# Patient Record
Sex: Male | Born: 1955
Health system: Southern US, Community
[De-identification: ages and names within clinical notes are randomized; demographics above are authoritative.]

## PROBLEM LIST (undated history)

## (undated) DIAGNOSIS — E785 Hyperlipidemia, unspecified: Secondary | ICD-10-CM

## (undated) DIAGNOSIS — T7840XA Allergy, unspecified, initial encounter: Secondary | ICD-10-CM

## (undated) DIAGNOSIS — M1990 Unspecified osteoarthritis, unspecified site: Secondary | ICD-10-CM

## (undated) HISTORY — PX: HIP SURGERY: SHX245

## (undated) HISTORY — DX: Allergy, unspecified, initial encounter: T78.40XA

## (undated) HISTORY — DX: Unspecified osteoarthritis, unspecified site: M19.90

## (undated) HISTORY — PX: SHOULDER SURGERY: SHX246

## (undated) HISTORY — PX: KNEE SURGERY: SHX244

## (undated) HISTORY — DX: Hyperlipidemia, unspecified: E78.5

---

## 2013-03-02 ENCOUNTER — Ambulatory Visit (INDEPENDENT_AMBULATORY_CARE_PROVIDER_SITE_OTHER): Payer: Medicare HMO | Admitting: Family Medicine

## 2013-03-02 ENCOUNTER — Encounter: Payer: Self-pay | Admitting: Family Medicine

## 2013-03-02 VITALS — BP 146/87 | HR 77 | Temp 98.0°F | Resp 18 | Ht 73.0 in | Wt 360.0 lb

## 2013-03-02 DIAGNOSIS — E785 Hyperlipidemia, unspecified: Secondary | ICD-10-CM

## 2013-03-02 DIAGNOSIS — R600 Localized edema: Secondary | ICD-10-CM

## 2013-03-02 DIAGNOSIS — Z23 Encounter for immunization: Secondary | ICD-10-CM

## 2013-03-02 DIAGNOSIS — R609 Edema, unspecified: Secondary | ICD-10-CM

## 2013-03-02 DIAGNOSIS — Z139 Encounter for screening, unspecified: Secondary | ICD-10-CM

## 2013-03-02 DIAGNOSIS — Z0000 Encounter for general adult medical examination without abnormal findings: Secondary | ICD-10-CM

## 2013-03-02 LAB — CBC WITH DIFFERENTIAL/PLATELET
BASOS ABS: 0 10*3/uL (ref 0.0–0.1)
BASOS PCT: 0 % (ref 0–1)
Eosinophils Absolute: 0.1 10*3/uL (ref 0.0–0.7)
Eosinophils Relative: 1 % (ref 0–5)
HCT: 44.1 % (ref 39.0–52.0)
Hemoglobin: 15.2 g/dL (ref 13.0–17.0)
LYMPHS PCT: 40 % (ref 12–46)
Lymphs Abs: 2.2 10*3/uL (ref 0.7–4.0)
MCH: 28.9 pg (ref 26.0–34.0)
MCHC: 34.5 g/dL (ref 30.0–36.0)
MCV: 83.8 fL (ref 78.0–100.0)
MONO ABS: 0.6 10*3/uL (ref 0.1–1.0)
Monocytes Relative: 10 % (ref 3–12)
NEUTROS PCT: 49 % (ref 43–77)
Neutro Abs: 2.6 10*3/uL (ref 1.7–7.7)
PLATELETS: 232 10*3/uL (ref 150–400)
RBC: 5.26 MIL/uL (ref 4.22–5.81)
RDW: 13.7 % (ref 11.5–15.5)
WBC: 5.4 10*3/uL (ref 4.0–10.5)

## 2013-03-02 LAB — POCT URINALYSIS DIPSTICK
Bilirubin, UA: NEGATIVE
Blood, UA: NEGATIVE
Glucose, UA: NEGATIVE
Ketones, UA: NEGATIVE
LEUKOCYTES UA: NEGATIVE
NITRITE UA: NEGATIVE
PROTEIN UA: NEGATIVE
Spec Grav, UA: 1.015
UROBILINOGEN UA: 4
pH, UA: 7

## 2013-03-02 LAB — COMPLETE METABOLIC PANEL WITH GFR
ALT: 23 U/L (ref 0–53)
AST: 19 U/L (ref 0–37)
Albumin: 4.1 g/dL (ref 3.5–5.2)
Alkaline Phosphatase: 93 U/L (ref 39–117)
BUN: 11 mg/dL (ref 6–23)
CALCIUM: 9.1 mg/dL (ref 8.4–10.5)
CHLORIDE: 103 meq/L (ref 96–112)
CO2: 30 meq/L (ref 19–32)
Creat: 0.95 mg/dL (ref 0.50–1.35)
GFR, Est African American: >89 mL/min
GFR, Est Non African American: 88 mL/min
Glucose, Bld: 81 mg/dL (ref 70–99)
Potassium: 4.8 mEq/L (ref 3.5–5.3)
SODIUM: 142 meq/L (ref 135–145)
TOTAL PROTEIN: 7.6 g/dL (ref 6.0–8.3)
Total Bilirubin: 0.9 mg/dL (ref 0.2–1.2)

## 2013-03-02 LAB — IFOBT (OCCULT BLOOD): IFOBT: NEGATIVE

## 2013-03-02 LAB — TSH: TSH: 0.073 u[IU]/mL — AB (ref 0.350–4.500)

## 2013-03-02 LAB — LIPID PANEL
Cholesterol: 198 mg/dL (ref 0–200)
HDL: 40 mg/dL (ref >39)
LDL CALC: 134 mg/dL — AB (ref 0–99)
Total CHOL/HDL Ratio: 5 Ratio
Triglycerides: 118 mg/dL (ref <150)
VLDL: 24 mg/dL (ref 0–40)

## 2013-03-02 LAB — T4, FREE: FREE T4: 1.28 ng/dL (ref 0.80–1.80)

## 2013-03-02 LAB — T3, FREE: T3, Free: 2.7 pg/mL (ref 2.3–4.2)

## 2013-03-02 MED ORDER — FUROSEMIDE 40 MG PO TABS: 40.0000 mg | ORAL_TABLET | Freq: Every day | ORAL | Status: DC

## 2013-03-02 MED ORDER — MELOXICAM 15 MG PO TABS: 15.0000 mg | ORAL_TABLET | Freq: Every day | ORAL | Status: DC

## 2013-03-02 MED ORDER — FLUTICASONE PROPIONATE 50 MCG/ACT NA SUSP: 2.0000 | Freq: Every day | NASAL | Status: DC

## 2013-03-02 MED ORDER — SIMVASTATIN 80 MG PO TABS: 80.0000 mg | ORAL_TABLET | Freq: Every day | ORAL | Status: DC

## 2013-03-02 NOTE — Progress Notes (Signed)
Subjective:    Patient ID: Nathan Fleming, male    DOB: March 20, 1955, 58 y.o.   MRN: [id number]  HPI  This 58 y.o. AA male is new to [place]. He is disabled and on [facility]; this is a subsequent annual exam. He has chronic lower ext edema which is effectively treated w/ Furosemide. Chronic dyslipidemia is treated w/ Simvastatin w/o mention of adverse effects. Pt's disabling conditions include DJD, s/p joint replacement surgeries and chronic LBP.   Prior to Admission medications   Medication Sig Start Date End Date Taking? Authorizing Provider  fluticasone (FLONASE) 50 MCG/ACT nasal spray Place 2 sprays into both nostrils daily.   Yes   furosemide (LASIX) 40 MG tablet Take 1 tablet (40 mg total) by mouth daily.   Yes   meloxicam (MOBIC) 15 MG tablet Take 1 tablet (15 mg total) by mouth daily.   Yes   simvastatin (ZOCOR) 80 MG tablet Take 1 tablet (80 mg total) by mouth daily.   Yes     Review of Systems  Constitutional: Negative.   HENT: Positive for congestion, postnasal drip, rhinorrhea and sneezing. Negative for sinus pressure, sore throat and trouble swallowing.   Eyes: Negative.   Respiratory: Positive for cough. Negative for choking, chest tightness and wheezing.   Cardiovascular: Negative.   Gastrointestinal: Negative.   Musculoskeletal: Positive for arthralgias, back pain and gait problem. Negative for joint swelling, myalgias and neck pain.  Skin: Negative.   Allergic/Immunologic: Negative.   Neurological: Negative.   Hematological: Negative.   Psychiatric/Behavioral: Negative.       Objective:   Physical Exam  Nursing note and vitals reviewed. Constitutional: He is oriented to person, place, and time. Vital signs are normal. He appears well-developed and well-nourished.  HENT:  Head: Normocephalic and atraumatic.  Right Ear: Hearing and external ear normal.  Left Ear: Hearing and external ear normal.  Nose: Nose normal. No mucosal edema, nasal deformity or septal deviation.    Mouth/Throat: Uvula is midline and oropharynx is clear and moist. No oral lesions. Normal dentition.  Eyes: Conjunctivae, EOM and lids are normal. Pupils are equal, round, and reactive to light. No scleral icterus.  Fundoscopic exam:      The right eye shows no arteriolar narrowing, no hemorrhage and no papilledema. The right eye shows red reflex.       The left eye shows no arteriolar narrowing, no hemorrhage and no papilledema. The left eye shows red reflex.  Neck: Normal range of motion. Neck supple. No hepatojugular reflux and no JVD present. No spinous process tenderness and no muscular tenderness present. Carotid bruit is not present. Normal range of motion present. No mass and no thyromegaly present.  Cardiovascular: Normal rate, regular rhythm, S1 normal, S2 normal, normal heart sounds, intact distal pulses and normal pulses.   No extrasystoles are present. PMI is displaced.  Exam reveals no gallop and no friction rub.   No murmur heard. Pulmonary/Chest: Effort normal and breath sounds normal. No respiratory distress. He has no wheezes. He has no rhonchi. He has no rales.  Decreased BS at bases.  Abdominal: Soft. Bowel sounds are normal. He exhibits no distension, no pulsatile midline mass and no mass. There is no hepatosplenomegaly. There is no tenderness. There is no guarding and no CVA tenderness. No hernia.  Increased abd girth due to obesity; difficult to assess intra-abd organs.  Genitourinary: Rectum normal and prostate normal. Rectal exam shows no external hemorrhoid, no fissure, no mass, no tenderness and anal tone  normal. Guaiac negative stool. Prostate is not enlarged and not tender.  Musculoskeletal: He exhibits edema.       Right hip: He exhibits decreased range of motion. He exhibits no tenderness and no deformity.       Left hip: He exhibits decreased range of motion. He exhibits no bony tenderness and no deformity.       Left knee: He exhibits no effusion, no deformity and  normal alignment.       Cervical back: Normal.       Thoracic back: Normal.       Lumbar back: Normal.  Well healed anterior scar- L knee. Major joints in lower ext w/ decreased ROM.  Lymphadenopathy:       Head (right side): No submental, no submandibular, no tonsillar, no preauricular, no posterior auricular and no occipital adenopathy present.       Head (left side): No submental, no submandibular, no tonsillar, no preauricular, no posterior auricular and no occipital adenopathy present.    He has no cervical adenopathy.    He has no axillary adenopathy.       Right: No inguinal and no supraclavicular adenopathy present.       Left: No inguinal and no supraclavicular adenopathy present.  Neurological: He is alert and oriented to person, place, and time. He has normal strength. He displays no atrophy and no tremor. No cranial nerve deficit or sensory deficit. He exhibits normal muscle tone. He displays no seizure activity. Coordination and gait normal.  Difficult to assess DTRs.  Skin: Skin is warm, dry and intact. No ecchymosis, no lesion and no rash noted. He is not diaphoretic. No cyanosis or erythema. Nails show no clubbing.  Psychiatric: He has a normal mood and affect. His speech is normal and behavior is normal. Judgment and thought content normal. Cognition and memory are normal.    Results for orders placed in visit on 03/02/13  POCT URINALYSIS DIPSTICK      Result Value Ref Range   Color, UA yellow     Clarity, UA clear     Glucose, UA neg     Bilirubin, UA neg     Ketones, UA neg     Spec Grav, UA 1.015     Blood, UA neg     pH, UA 7.0     Protein, UA neg     Urobilinogen, UA 4.0     Nitrite, UA neg     Leukocytes, UA Negative    IFOBT (OCCULT BLOOD)      Result Value Ref Range   IFOBT Negative     ECG: NSR; borderline- short PR interval. No ST-TW changes. No ectopy.     Assessment & Plan:  Encounter for Medicare annual wellness exam - Plan: POCT urinalysis  dipstick, Flu Vaccine QUAD 36+ mos IM, IFOBT POC (occult bld, rslt in office), CBC with Differential, TSH, T4, free, T3, Free, COMPLETE METABOLIC PANEL WITH GFR, PSA, Medicare, EKG 12-Lead  Edema of both legs- Continue Furosemide daily.  Dyslipidemia - Plan: Lipid panel  Obesity, morbid- Encouraged weight reduction w/ some minor changes in nutrition and getting more active on a daily basis (as joint pain permits).  Meds ordered this encounter  Medications  . DISCONTD: simvastatin (ZOCOR) 80 MG tablet    Sig: Take 80 mg by mouth daily.  Marland Kitchen DISCONTD: furosemide (LASIX) 40 MG tablet    Sig: Take 40 mg by mouth.  . DISCONTD: meloxicam (MOBIC) 15 MG tablet  Sig: Take 15 mg by mouth daily.  Marland Kitchen DISCONTD: fluticasone (FLONASE) 50 MCG/ACT nasal spray    Sig: Place into both nostrils daily.  . furosemide (LASIX) 40 MG tablet    Sig: Take 1 tablet (40 mg total) by mouth daily.    Dispense:  30 tablet    Refill:  5  . meloxicam (MOBIC) 15 MG tablet    Sig: Take 1 tablet (15 mg total) by mouth daily.    Dispense:  30 tablet    Refill:  5  . simvastatin (ZOCOR) 80 MG tablet    Sig: Take 1 tablet (80 mg total) by mouth daily.    Dispense:  30 tablet    Refill:  5  . fluticasone (FLONASE) 50 MCG/ACT nasal spray    Sig: Place 2 sprays into both nostrils daily.    Dispense:  16 g    Refill:  5

## 2013-03-02 NOTE — Patient Instructions (Signed)
Keeping you healthy  Get these tests  Blood pressure- Have your blood pressure checked once a year by your healthcare provider.  Normal blood pressure is 120/80  Weight- Have your body mass index (BMI) calculated to screen for obesity.  BMI is a measure of body fat based on height and weight. You can also calculate your own BMI at [email or web address].[email or web address].  Cholesterol- Have your cholesterol checked every year.  Diabetes- Have your blood sugar checked regularly if you have high blood pressure, high cholesterol, have a family history of diabetes or if you are overweight.  Screening for Colon Cancer- Colonoscopy starting at age 59.  Screening may begin sooner depending on your family history and other health conditions. Follow up colonoscopy as directed by your Gastroenterologist. Done in 2013.  Screening for Prostate Cancer- Both blood work (PSA) and a rectal exam help screen for Prostate Cancer.  Screening begins at age 50 with African-American men and at age 13 with Caucasian men.  Screening may begin sooner depending on your family history.  Take these medicines  Aspirin- One aspirin daily can help prevent Heart disease and Stroke.  Flu shot- Every fall. You received this vaccine today.  Tetanus- Every 10 years. We will review your records once we receive them and see if this vaccine is current.  Zostavax- Once after the age of 59 to prevent Shingles.  Pneumonia shot- Once after the age of 30; if you are younger than 23, ask your healthcare provider if you need a Pneumonia shot.  Take these steps  Don't smoke- If you do smoke, talk to your doctor about quitting.  For tips on how to quit, go to www.smokefree.gov or call 1-800-QUIT-NOW.  Be physically active- Exercise 5 days a week for at least 30 minutes.  If you are not already physically active start slow and gradually work up to 30 minutes of moderate physical activity.  Examples of moderate activity include walking briskly,  mowing the yard, dancing, swimming, bicycling, etc.  Eat a healthy diet- Eat a variety of healthy food such as fruits, vegetables, low fat milk, low fat cheese, yogurt, lean meant, poultry, fish, beans, tofu, etc. For more information go to www.thenutritionsource.org  Drink alcohol in moderation- Limit alcohol intake to less than two drinks a day. Never drink and drive.  Dentist- Brush and floss twice daily; visit your dentist twice a year.  Depression- Your emotional health is as important as your physical health. If you're feeling down, or losing interest in things you would normally enjoy please talk to your healthcare provider.  Eye exam- Visit your eye doctor every year.  Safe sex- If you may be exposed to a sexually transmitted infection, use a condom.  Seat belts- Seat belts can save your life; always wear one.  Smoke/Carbon Monoxide detectors- These detectors need to be installed on the appropriate level of your home.  Replace batteries at least once a year.  Skin cancer- When out in the sun, cover up and use sunscreen 15 SPF or higher.  Violence- If anyone is threatening you, please tell your healthcare provider.  Living Will/ Health care power of attorney- Speak with your healthcare provider and family.

## 2013-03-03 LAB — PSA, MEDICARE: PSA: 0.28 ng/mL (ref <=4.00)

## 2013-03-04 DIAGNOSIS — E785 Hyperlipidemia, unspecified: Secondary | ICD-10-CM | POA: Insufficient documentation

## 2013-03-04 DIAGNOSIS — R600 Localized edema: Secondary | ICD-10-CM | POA: Insufficient documentation

## 2013-03-04 NOTE — Progress Notes (Signed)
Quick Note:  Please advise pt regarding following labs...  Blood counts, chemistries, blood sugar, kidney and liver tests are normal. 2 of the 3 thyroid function tests are normal. We will continue to monitor thyroid function. Lipid panel shows elevated LDL ("bad") cholesterol. Continue taking Simvastatin at current dose; work on eating healthier and weight loss. Your heart disease risk Is average based on the cholesterol profile.  Copy to pt. ______

## 2013-03-05 ENCOUNTER — Encounter: Payer: Self-pay | Admitting: *Deleted

## 2013-08-30 ENCOUNTER — Encounter: Payer: Self-pay | Admitting: Family Medicine

## 2013-08-30 ENCOUNTER — Ambulatory Visit (INDEPENDENT_AMBULATORY_CARE_PROVIDER_SITE_OTHER): Payer: Medicare HMO | Admitting: Family Medicine

## 2013-08-30 VITALS — BP 140/90 | HR 66 | Temp 98.9°F | Resp 16 | Ht 74.0 in | Wt 354.8 lb

## 2013-08-30 DIAGNOSIS — E785 Hyperlipidemia, unspecified: Secondary | ICD-10-CM

## 2013-08-30 DIAGNOSIS — N529 Male erectile dysfunction, unspecified: Secondary | ICD-10-CM | POA: Insufficient documentation

## 2013-08-30 DIAGNOSIS — R0600 Dyspnea, unspecified: Secondary | ICD-10-CM

## 2013-08-30 DIAGNOSIS — H109 Unspecified conjunctivitis: Secondary | ICD-10-CM

## 2013-08-30 DIAGNOSIS — R7989 Other specified abnormal findings of blood chemistry: Secondary | ICD-10-CM

## 2013-08-30 DIAGNOSIS — R0989 Other specified symptoms and signs involving the circulatory and respiratory systems: Secondary | ICD-10-CM

## 2013-08-30 DIAGNOSIS — R0609 Other forms of dyspnea: Secondary | ICD-10-CM

## 2013-08-30 LAB — BASIC METABOLIC PANEL WITH GFR
BUN: 15 mg/dL (ref 6–23)
CALCIUM: 9 mg/dL (ref 8.4–10.5)
CO2: 29 mEq/L (ref 19–32)
Chloride: 103 mEq/L (ref 96–112)
Creat: 1 mg/dL (ref 0.50–1.35)
GFR, EST NON AFRICAN AMERICAN: 83 mL/min
GFR, Est African American: >89 mL/min
Glucose, Bld: 83 mg/dL (ref 70–99)
POTASSIUM: 4.4 meq/L (ref 3.5–5.3)
SODIUM: 139 meq/L (ref 135–145)

## 2013-08-30 LAB — LIPID PANEL
CHOLESTEROL: 191 mg/dL (ref 0–200)
HDL: 40 mg/dL (ref >39)
LDL CALC: 125 mg/dL — AB (ref 0–99)
TRIGLYCERIDES: 129 mg/dL (ref <150)
Total CHOL/HDL Ratio: 4.8 Ratio
VLDL: 26 mg/dL (ref 0–40)

## 2013-08-30 LAB — ALT: ALT: 41 U/L (ref 0–53)

## 2013-08-30 LAB — TSH: TSH: 0.203 u[IU]/mL — AB (ref 0.350–4.500)

## 2013-08-30 LAB — T3, FREE: T3 FREE: 2.8 pg/mL (ref 2.3–4.2)

## 2013-08-30 MED ORDER — ALBUTEROL SULFATE HFA 108 (90 BASE) MCG/ACT IN AERS: 2.0000 | INHALATION_SPRAY | Freq: Four times a day (QID) | RESPIRATORY_TRACT | Status: DC | PRN

## 2013-08-30 MED ORDER — BLOOD PRESSURE MONITOR/WRIST DEVI: 1.0000 | Freq: Every day | Status: DC

## 2013-08-30 MED ORDER — FLUTICASONE PROPIONATE 50 MCG/ACT NA SUSP: 2.0000 | Freq: Every day | NASAL | Status: DC

## 2013-08-30 MED ORDER — SIMVASTATIN 80 MG PO TABS: 80.0000 mg | ORAL_TABLET | Freq: Every day | ORAL | Status: DC

## 2013-08-30 MED ORDER — FUROSEMIDE 40 MG PO TABS: 40.0000 mg | ORAL_TABLET | Freq: Every day | ORAL | Status: DC

## 2013-08-30 NOTE — Progress Notes (Signed)
Subjective:    Patient ID: Nathan Fleming, male    DOB: 1955/03/24, 58 y.o.   MRN: [id number]  HPI This 58 y.o. AA male is here for follow-up; he has been compliant w/ medication (for edema) 90% of the time and reports no adverse effects. He does not check BP at home but would like a cuff. Pt has a recliner but does not elevate legs when he has edema. Being active seems to reduce lower leg swelling.He c/o SOB and mild wheezing when he visits [facility]; air pollution causes resp symptoms; otherwise, he has no chronic cough, tightness in chest, DOE or wheezing. HA physician in [place] prescribed Albuterol MDI. ( I observed pt demonstrating use and instructed him on proper administration for maximum benefit).  Pt takes Furosemide for edema (has missed some doses  Pt has redness of L eye w/o pain or vision impairment. Onset 4-5 days ago while working in the yard; he rubbed his eye while wearing yard gloves. He has no mattering but eye itches. He does have seasonal allergies. No recent eye exam by eye care professional.  New c/o erectile dysfunction for 1-2 months. Pt wonders if this is related to medication.   Patient Active Problem List   Diagnosis Date Noted  . Edema of both legs 03/04/2013  . Dyslipidemia 03/04/2013  . Obesity, morbid 03/04/2013   Prior to Admission medications   Medication Sig Start Date End Date Taking? Authorizing Provider  fluticasone (FLONASE) 50 MCG/ACT nasal spray Place 2 sprays into both nostrils daily.   Yes   furosemide (LASIX) 40 MG tablet Take 1 tablet (40 mg total) by mouth daily.   Yes Barton Fanny, MD  meloxicam (MOBIC) 15 MG tablet Take 1 tablet (15 mg total) by mouth daily. 03/02/13  Yes Barton Fanny, MD  simvastatin (ZOCOR) 80 MG tablet Take 1 tablet (80 mg total) by mouth daily.   Yes Barton Fanny, MD    No Known Allergies   Past Surgical History  Procedure Laterality Date  . Knee surgery    . Hip surgery    . Shoulder surgery       Family History  Problem Relation Age of Onset  . Diabetes Mother   . Stroke Father   . Diabetes Sister      History   Social History  . Marital Status: Married    Spouse Name: N/A    Number of Children: N/A  . Years of Education: N/A   Occupational History  . retired    Social History Main Topics  . Smoking status: Former Smoker -- 0.25 packs/day for 10 years    Quit date: 01/19/1988  . Smokeless tobacco: Never Used  . Alcohol Use: No  . Drug Use: No  . Sexual Activity: Yes   Other Topics Concern  . Not on file   Social History Narrative   Married;   Moved from [place] in 2014   Retired   1 son    Review of Systems  Constitutional: Negative.   HENT: Negative for congestion, facial swelling, postnasal drip, rhinorrhea, sinus pressure and sore throat.   Eyes: Positive for redness and itching. Negative for photophobia, pain, discharge and visual disturbance.  Respiratory: Positive for shortness of breath and wheezing. Negative for cough and chest tightness.   Cardiovascular: Negative.   Skin: Negative.   Allergic/Immunologic: Positive for environmental allergies.  Neurological: Negative.   Psychiatric/Behavioral: Negative.       Objective:  Physical Exam  Nursing note and vitals reviewed. Constitutional: He is oriented to person, place, and time. He appears well-developed and well-nourished. No distress.  HENT:  Head: Normocephalic and atraumatic.  Right Ear: External ear normal.  Left Ear: External ear normal.  Nose: Nose normal.  Mouth/Throat: Oropharynx is clear and moist.  Eyes: EOM and lids are normal. Pupils are equal, round, and reactive to light. Right conjunctiva is not injected. Right conjunctiva has no hemorrhage. Left conjunctiva has a hemorrhage. No scleral icterus.  Neck: Normal range of motion. Neck supple. No thyromegaly present.  Cardiovascular: Normal rate, regular rhythm and normal heart sounds.  Exam reveals no gallop and no friction  rub.   No murmur heard. Pulmonary/Chest: Effort normal and breath sounds normal. No respiratory distress. He has no wheezes.  Musculoskeletal: Normal range of motion. He exhibits no tenderness.  Trace pre-tibial edema.  Neurological: He is alert and oriented to person, place, and time. No cranial nerve deficit. He exhibits normal muscle tone. Coordination normal.  Skin: Skin is warm and dry. He is not diaphoretic. No erythema.  Psychiatric: He has a normal mood and affect. His behavior is normal. Judgment and thought content normal.       Assessment & Plan:  Dyspnea - Plan: BASIC METABOLIC PANEL WITH GFR, ALT, Testosterone, free, total  Dyslipidemia - Plan: TSH, Lipid panel, ALT  Conjunctivitis of left eye - Plan: Ambulatory referral to Ophthalmology  Abnormal TSH - Plan: T3, free, TSH, Lipid panel  Erectile dysfunction, unspecified erectile dysfunction type - Plan: Testosterone, free, total, T3, free, Testosterone, free, total  Meds ordered this encounter  Medications  Dyspnea - Plan: BASIC METABOLIC PANEL WITH GFR, ALT, Testosterone, free, total  Dyslipidemia - Plan: TSH, Lipid panel, ALT  Conjunctivitis of left eye - Plan: Ambulatory referral to Ophthalmology  Abnormal TSH - Plan: T3, free, TSH, Lipid panel  Erectile dysfunction, unspecified erectile dysfunction type - Plan: Testosterone, free, total, T3, free, Testosterone, free, total   Meds ordered this encounter  Medications  . fluticasone (FLONASE) 50 MCG/ACT nasal spray    Sig: Place 2 sprays into both nostrils daily.    Dispense:  16 g    Refill:  5  . furosemide (LASIX) 40 MG tablet    Sig: Take 1 tablet (40 mg total) by mouth daily.    Dispense:  30 tablet    Refill:  5  . simvastatin (ZOCOR) 80 MG tablet    Sig: Take 1 tablet (80 mg total) by mouth daily.    Dispense:  30 tablet    Refill:  5  . albuterol (PROVENTIL HFA;VENTOLIN HFA) 108 (90 BASE) MCG/ACT inhaler    Sig: Inhale 2 puffs into the lungs  every 6 (six) hours as needed for wheezing or shortness of breath.    Dispense:  1 Inhaler    Refill:  1  . Blood Pressure Monitoring (BLOOD PRESSURE MONITOR/WRIST) DEVI    Sig: 1 Device by Does not apply route daily.    Dispense:  1 Device    Refill:  0

## 2013-08-30 NOTE — Patient Instructions (Addendum)
You can go today to the opthalmology office arrive at 2:15 Dr Katy Fitch office [address]( across from [facility]) bring your copay and your insurance card, and a photo ID.   You have a prescription for a blood pressure cuff. Try to check your blood pressure once a day or 3-4 times a week; be sure you are seated in a comfortable chair with your feet on the floor. You should be relaxed when checking your pressure.  Continue to exercise on the treadmill.   Below is some information about MEDITERRANEAN DIET which is heart-healthy lifestyle.        Mediterranean Diet  Why follow it? Research shows.   Those who follow the Mediterranean diet have a reduced risk of heart disease    The diet is associated with a reduced incidence of Parkinson's and Alzheimer's diseases   People following the diet may have longer life expectancies and lower rates of chronic diseases    The Dietary Guidelines for Americans recommends the Mediterranean diet as an eating plan to promote health and prevent disease  What Is the Mediterranean Diet?    Healthy eating plan based on typical foods and recipes of Mediterranean-style cooking   The diet is primarily a plant based diet; these foods should make up a majority of meals   Starches - Plant based foods should make up a majority of meals - They are an important sources of vitamins, minerals, energy, antioxidants, and fiber - Choose whole grains, foods high in fiber and minimally processed items  - Typical grain sources include wheat, oats, barley, corn, brown rice, bulgar, farro, millet, polenta, couscous  - Various types of beans include chickpeas, lentils, fava beans, black beans, white beans   Fruits  Veggies - Large quantities of antioxidant rich fruits & veggies; 6 or more servings  - Vegetables can be eaten raw or lightly drizzled with oil and cooked  - Vegetables common to the traditional Mediterranean Diet include: artichokes, arugula,  beets, broccoli, brussel sprouts, cabbage, carrots, celery, collard greens, cucumbers, eggplant, kale, leeks, lemons, lettuce, mushrooms, okra, onions, peas, peppers, potatoes, pumpkin, radishes, rutabaga, shallots, spinach, sweet potatoes, turnips, zucchini - Fruits common to the Mediterranean Diet include: apples, apricots, avocados, cherries, clementines, dates, figs, grapefruits, grapes, melons, nectarines, oranges, peaches, pears, pomegranates, strawberries, tangerines  Fats - Replace butter and margarine with healthy oils, such as olive oil, canola oil, and tahini  - Limit nuts to no more than a handful a day  - Nuts include walnuts, almonds, pecans, pistachios, pine nuts  - Limit or avoid candied, honey roasted or heavily salted nuts - Olives are central to the [employer] - can be eaten whole or used in a variety of dishes   Meats Protein - Limiting red meat: no more than a few times a month - When eating red meat: choose lean cuts and keep the portion to the size of deck of cards - Eggs: approx. 0 to 4 times a week  - Fish and lean poultry: at least 2 a week  - Healthy protein sources include, chicken, [place], lean beef, lamb - Increase intake of seafood such as tuna, salmon, trout, mackerel, shrimp, scallops - Avoid or limit high fat processed meats such as sausage and bacon  Dairy - Include moderate amounts of low fat dairy products  - Focus on healthy dairy such as fat free yogurt, skim milk, low or reduced fat cheese - Limit dairy products higher in fat  such as whole or 2% milk, cheese, ice cream  Alcohol - Moderate amounts of red wine is ok  - No more than 5 oz daily for women (all ages) and men older than age 57  - No more than 10 oz of wine daily for men younger than 6  Other - Limit sweets and other desserts  - Use herbs and spices instead of salt to flavor foods  - Herbs and spices common to the traditional Mediterranean Diet include: basil, bay leaves, chives, cloves,  cumin, fennel, garlic, lavender, marjoram, mint, oregano, parsley, pepper, rosemary, sage, savory, sumac, tarragon, thyme   It's not just a diet, it's a lifestyle:    The Mediterranean diet includes lifestyle factors typical of those in the region    Foods, drinks and meals are best eaten with others and savored   Daily physical activity is important for overall good health   This could be strenuous exercise like running and aerobics   This could also be more leisurely activities such as walking, housework, yard-work, or taking the stairs   Moderation is the key; a balanced and healthy diet accommodates most foods and drinks   Consider portion sizes and frequency of consumption of certain foods   Meal Ideas & Options:    Breakfast:  o Whole wheat toast or whole wheat English muffins with peanut butter & hard boiled egg o Steel cut oats topped with apples & cinnamon and skim milk  o Fresh fruit: banana, strawberries, melon, berries, peaches  o Smoothies: strawberries, bananas, greek yogurt, peanut butter o Low fat greek yogurt with blueberries and granola  o Egg white omelet with spinach and mushrooms o Breakfast couscous: whole wheat couscous, apricots, skim milk, cranberries    Sandwiches:  o Hummus and grilled vegetables (peppers, zucchini, squash) on whole wheat bread   o Grilled chicken on whole wheat pita with lettuce, tomatoes, cucumbers or tzatziki  o Tuna salad on whole wheat bread: tuna salad made with greek yogurt, olives, red peppers, capers, green onions o Garlic rosemary lamb pita: lamb sauted with garlic, rosemary, salt & pepper; add lettuce, cucumber, greek yogurt to pita - flavor with lemon juice and black pepper    Seafood:  o Mediterranean grilled salmon, seasoned with garlic, basil, parsley, lemon juice and black pepper o Shrimp, lemon, and spinach whole-grain pasta salad made with low fat greek yogurt  o Seared scallops with lemon orzo  o Seared tuna steaks seasoned  salt, pepper, coriander topped with tomato mixture of olives, tomatoes, olive oil, minced garlic, parsley, green onions and cappers    Meats:  o Herbed greek chicken salad with kalamata olives, cucumber, feta  o Red bell peppers stuffed with spinach, bulgur, lean ground beef (or lentils) & topped with feta   o Kebabs: skewers of chicken, tomatoes, onions, zucchini, squash  o [place] burgers: made with red onions, mint, dill, lemon juice, feta cheese topped with roasted red peppers   Vegetarian o Cucumber salad: cucumbers, artichoke hearts, celery, red onion, feta cheese, tossed in olive oil & lemon juice  o Hummus and whole grain pita points with a greek salad (lettuce, tomato, feta, olives, cucumbers, red onion) o Lentil soup with celery, carrots made with vegetable broth, garlic, salt and pepper  o Tabouli salad: parsley, bulgur, mint, scallions, cucumbers, tomato, radishes, lemon juice, olive oil, salt and pepper.     Erectile Dysfunction Erectile dysfunction is the inability to get or sustain a good enough erection to have sexual  intercourse. Erectile dysfunction may involve:  Inability to get an erection.  Lack of enough hardness to allow penetration.  Loss of the erection before sex is finished.  Premature ejaculation. CAUSES  Certain drugs, such as:  Pain relievers.  Antihistamines.  Antidepressants.  Blood pressure medicines.  Water pills (diuretics).  Ulcer medicines.  Muscle relaxants.  Illegal drugs.  Excessive drinking.  Psychological causes, such as:  Anxiety.  Depression.  Sadness.  Exhaustion.  Performance fear.  Stress.  Physical causes, such as:  Artery problems. This may include diabetes, smoking, liver disease, or atherosclerosis.  High blood pressure.  Hormonal problems, such as low testosterone.  Obesity.  Nerve problems. This may include back or pelvic injuries, diabetes mellitus, multiple sclerosis, or Parkinson  disease. SYMPTOMS  Inability to get an erection.  Lack of enough hardness to allow penetration.  Loss of the erection before sex is finished.  Premature ejaculation.  Normal erections at some times, but with frequent unsatisfactory episodes.  Orgasms that are not satisfactory in sensation or frequency.  Low sexual satisfaction in either partner because of erection problems.  A curved penis occurring with erection. The curve may cause pain or may be too curved to allow for intercourse.  Never having nighttime erections. DIAGNOSIS Your caregiver can often diagnose this condition by:  Performing a physical exam to find other diseases or specific problems with the penis.  Asking you detailed questions about the problem.  Performing blood tests to check for diabetes mellitus or to measure hormone levels.  Performing urine tests to find other underlying health conditions.  Performing an ultrasound exam to check for scarring.  Performing a test to check blood flow to the penis.  Doing a sleep study at home to measure nighttime erections. TREATMENT   You may be prescribed medicines by mouth.  You may be given medicine injections into the penis.  You may be prescribed a vacuum pump with a ring.  Penile implant surgery may be performed. You may receive:  An inflatable implant.  A semirigid implant.  Blood vessel surgery may be performed. HOME CARE INSTRUCTIONS  If you are prescribed oral medicine, you should take the medicine as prescribed. Do not increase the dosage without first discussing it with your physician.  If you are using self-injections, be careful to avoid any veins that are on the surface of the penis. Apply pressure to the injection site for 5 minutes.  If you are using a vacuum pump, make sure you have read the instructions before using it. Discuss any questions with your physician before taking the pump home. SEEK MEDICAL CARE IF:  You experience  pain that is not responsive to the pain medicine you have been prescribed.  You experience nausea or vomiting. SEEK IMMEDIATE MEDICAL CARE IF:   When taking oral or injectable medications, you experience an erection that lasts longer than 4 hours. If your physician is unavailable, go to the nearest emergency room for evaluation. An erection that lasts much longer than 4 hours can result in permanent damage to your penis.  You have pain that is severe.  You develop redness, severe pain, or severe swelling of your penis.  You have redness spreading up into your groin or lower abdomen.  You are unable to pass your urine. Document Released: 01/02/2000 Document Revised: 09/06/2012 Document Reviewed: 06/08/2012 [facility] Patient Information 2015 [place], [place]. This information is not intended to replace advice given to you by your health care provider. Make sure you discuss any  questions you have with your health care provider.  

## 2013-08-31 LAB — TESTOSTERONE, FREE, TOTAL, SHBG
SEX HORMONE BINDING: 26 nmol/L (ref 13–71)
TESTOSTERONE FREE: 51.9 pg/mL (ref 47.0–244.0)
TESTOSTERONE-% FREE: 2.2 % (ref 1.6–2.9)
Testosterone: 236 ng/dL — ABNORMAL LOW (ref 300–890)

## 2013-09-09 NOTE — Progress Notes (Signed)
Quick Note:  Please advise pt regarding following labs...  Testosterone level is below normal. Free Testosterone is in the normal range but is very low. Thyroid hormone level is abnormally low. These 2 abnormal labs may warrant an evaluation with an Endocrine specialist. If you are agreeable to this, a referral will be made to the specialist.  Other labs are normal.  Copy to pt. ______

## 2013-09-10 ENCOUNTER — Other Ambulatory Visit: Payer: Self-pay | Admitting: *Deleted

## 2013-09-10 DIAGNOSIS — R7989 Other specified abnormal findings of blood chemistry: Secondary | ICD-10-CM

## 2013-09-26 ENCOUNTER — Encounter: Payer: Self-pay | Admitting: Endocrinology

## 2013-09-26 ENCOUNTER — Ambulatory Visit (INDEPENDENT_AMBULATORY_CARE_PROVIDER_SITE_OTHER): Payer: Medicare HMO | Admitting: Endocrinology

## 2013-09-26 VITALS — BP 122/74 | HR 75 | Temp 97.8°F | Ht 74.0 in | Wt 340.0 lb

## 2013-09-26 DIAGNOSIS — E0590 Thyrotoxicosis, unspecified without thyrotoxic crisis or storm: Secondary | ICD-10-CM

## 2013-09-26 DIAGNOSIS — E291 Testicular hypofunction: Secondary | ICD-10-CM

## 2013-09-26 LAB — TESTOSTERONE: Testosterone: 203.39 ng/dL — ABNORMAL LOW (ref 300.00–890.00)

## 2013-09-26 LAB — LUTEINIZING HORMONE: LH: 13.38 m[IU]/mL — AB (ref 1.50–9.30)

## 2013-09-26 LAB — T4, FREE: Free T4: 1.05 ng/dL (ref 0.60–1.60)

## 2013-09-26 LAB — TSH: TSH: 0.02 u[IU]/mL — AB (ref 0.35–4.50)

## 2013-09-26 LAB — FOLLICLE STIMULATING HORMONE: FSH: 5.3 m[IU]/mL (ref 1.4–18.1)

## 2013-09-26 NOTE — Progress Notes (Signed)
Subjective:    Patient ID: Nathan Fleming, male    DOB: 22-Dec-1955, 58 y.o.   MRN: [id number]  HPI Pt was found on routine labs in early 2015, to have a suppressed TSH.  He has no h/o thyroid problems.  He has never had XRT to the anterior neck, or thyroid surgery.  He has never had thyroid imaging.  He does not consume kelp or any other prescribed or non-prescribed thyroid medication.  He has never been on amiodarone.   He has slight weight loss throughout the body, and assoc ED sxs.  Past Medical History  Diagnosis Date  . Allergy   . Hyperlipidemia   . Arthritis     Past Surgical History  Procedure Laterality Date  . Knee surgery    . Hip surgery    . Shoulder surgery      History   Social History  . Marital Status: Married    Spouse Name: N/A    Number of Children: N/A  . Years of Education: N/A   Occupational History  . retired    Social History Main Topics  . Smoking status: Former Smoker -- 0.25 packs/day for 10 years    Quit date: 01/19/1988  . Smokeless tobacco: Never Used  . Alcohol Use: No  . Drug Use: No  . Sexual Activity: Yes   Other Topics Concern  . Not on file   Social History Narrative   Married;   Moved from [place] in 2014   Retired   1 son    Current Outpatient Prescriptions on File Prior to Visit  Medication Sig Dispense Refill  . albuterol (PROVENTIL HFA;VENTOLIN HFA) 108 (90 BASE) MCG/ACT inhaler Inhale 2 puffs into the lungs every 6 (six) hours as needed for wheezing or shortness of breath.  1 Inhaler  1  . Blood Pressure Monitoring (BLOOD PRESSURE MONITOR/WRIST) DEVI 1 Device by Does not apply route daily.  1 Device  0  . fluticasone (FLONASE) 50 MCG/ACT nasal spray Place 2 sprays into both nostrils daily.  16 g  5  . furosemide (LASIX) 40 MG tablet Take 1 tablet (40 mg total) by mouth daily.  30 tablet  5  . meloxicam (MOBIC) 15 MG tablet Take 1 tablet (15 mg total) by mouth daily.  30 tablet  5  . simvastatin (ZOCOR) 80 MG tablet Take 1  tablet (80 mg total) by mouth daily.  30 tablet  5   No current facility-administered medications on file prior to visit.    No Known Allergies  Family History  Problem Relation Age of Onset  . Diabetes Mother   . Stroke Father   . Diabetes Sister   . Thyroid disease Neg Hx     BP 122/74  Pulse 75  Temp(Src) 97.8 F (36.6 C) (Oral)  Ht 6\' 2"  (1.88 m)  Wt 340 lb (154.223 kg)  BMI 43.63 kg/m2  SpO2 97%   Review of Systems denies fever, headache, hoarseness, double vision, palpitations, sob, diarrhea, polyuria, myalgias, excessive diaphoresis, numbness, tremor, anxiety, easy bruising, and rhinorrhea.       Objective:   Physical Exam VS: see vs page GEN: no distress.  Morbid obesity. HEAD: head: no deformity eyes: no periorbital swelling, no proptosis external nose and ears are normal mouth: no lesion seen NECK: supple, thyroid is not enlarged CHEST WALL: no deformity LUNGS: clear to auscultation BREASTS:  No gynecomastia CV: reg rate and rhythm, no murmur ABD: abdomen is soft, nontender.  no hepatosplenomegaly.  not distended.  Self reducing large midline ventral hernia MUSCULOSKELETAL: muscle bulk and strength are grossly normal.  no obvious joint swelling.  gait is normal and steady EXTEMITIES: no deformity.  1+ bilat leg edema PULSES: no carotid bruit NEURO:  cn 2-12 grossly intact.   readily moves all 4's.  sensation is intact to touch on all 4's.  No tremor SKIN:  Normal texture and temperature.  No rash or suspicious lesion is visible.  Not diaphoretic. NODES:  None palpable at the neck PSYCH: alert, well-oriented.  Does not appear anxious nor depressed.   Lab Results  Component Value Date   TSH 0.02* 09/26/2013   i have reviewed the following outside records: Office notes  i reviewed electrocardiogram: (03/05/13)     Assessment & Plan:  Hyperthyroidism: new to me, uncertain etiology Morbid obesity, slightly improved--this improvement may be  thyroid-related.  However, exercise would help obesity, and he will be more able to exercise after thyroid is normalized. Edema: this may improved with thyroid rx, so we'll recheck this in the future.    Patient is advised the following: Patient Instructions  blood tests and an ultrasound are being requested for you today.  We'll contact you with results. Based on the results, you should consider either the radioactive iodine treatment or a once a day pill to slow the thyroid.    If you choose the radioactive iodine, it works like this: We would first check a thyroid "scan" (a special, but easy and painless type of thyroid x ray).  It works like this: you go to the x-ray department of the hospital to swallow a pill, which contains a miniscule amount of radiation.  You will not notice any symptoms from this.  You will go back to the x-ray department the next day, to lie down in front of a camera.  The results of this will be sent to me.   Based on the results, i hope to order for you a treatment pill of radioactive iodine.  Although it is a larger amount of radiation, you will again notice no symptoms from this.  The pill is gone from your body in a few days (during which you should stay away from other people), but takes several months to work.  Therefore, please return here approximately 6-8 weeks after the treatment.  This treatment has been available for many years, and the only known side-effect is an underactive thyroid.  It is possible that i would eventually prescribe for you a thyroid hormone pill, which is very inexpensive.  You don't have to worry about side-effects of this thyroid hormone pill, because it is the same molecule your thyroid makes.

## 2013-09-26 NOTE — Patient Instructions (Signed)
blood tests and an ultrasound are being requested for you today.  We'll contact you with results. Based on the results, you should consider either the radioactive iodine treatment or a once a day pill to slow the thyroid.    If you choose the radioactive iodine, it works like this: We would first check a thyroid "scan" (a special, but easy and painless type of thyroid x ray).  It works like this: you go to the x-ray department of the hospital to swallow a pill, which contains a miniscule amount of radiation.  You will not notice any symptoms from this.  You will go back to the x-ray department the next day, to lie down in front of a camera.  The results of this will be sent to me.   Based on the results, i hope to order for you a treatment pill of radioactive iodine.  Although it is a larger amount of radiation, you will again notice no symptoms from this.  The pill is gone from your body in a few days (during which you should stay away from other people), but takes several months to work.  Therefore, please return here approximately 6-8 weeks after the treatment.  This treatment has been available for many years, and the only known side-effect is an underactive thyroid.  It is possible that i would eventually prescribe for you a thyroid hormone pill, which is very inexpensive.  You don't have to worry about side-effects of this thyroid hormone pill, because it is the same molecule your thyroid makes.

## 2013-09-27 ENCOUNTER — Encounter: Payer: Self-pay | Admitting: Family Medicine

## 2013-09-27 ENCOUNTER — Ambulatory Visit (INDEPENDENT_AMBULATORY_CARE_PROVIDER_SITE_OTHER): Payer: Medicare HMO | Admitting: Family Medicine

## 2013-09-27 VITALS — BP 120/80 | HR 73 | Temp 98.2°F | Resp 16 | Ht 74.0 in | Wt 336.6 lb

## 2013-09-27 DIAGNOSIS — R609 Edema, unspecified: Secondary | ICD-10-CM

## 2013-09-27 DIAGNOSIS — R600 Localized edema: Secondary | ICD-10-CM

## 2013-09-27 LAB — PROLACTIN: Prolactin: 7.2 ng/mL (ref 2.1–17.1)

## 2013-09-27 NOTE — Progress Notes (Signed)
S:  This 58 y.o. AA male is here for follow-up; he was seen ~ 1 month ago d for evaluation of dyspnea. Pt reports that he feels much better and has no SOB or DOE; mild lower ext edema persists. He is proud of weight loss. With Mediterranean Diet and walking on treadmill, he has lost 18 lbs.   Pt was evaluated by Dr. Renato Shin, endocrine specialist, yesterday. Labs pending and pt to be scheduled for thyroid scan.  Patient Active Problem List   Diagnosis Date Noted  . Hyperthyroidism 09/26/2013  . Other testicular hypofunction 09/26/2013  . Erectile dysfunction 08/30/2013  . Edema of both legs 03/04/2013  . Dyslipidemia 03/04/2013  . Obesity, morbid 03/04/2013    Prior to Admission medications   Medication Sig Start Date End Date Taking? Authorizing Provider  albuterol (PROVENTIL HFA;VENTOLIN HFA) 108 (90 BASE) MCG/ACT inhaler Inhale 2 puffs into the lungs every 6 (six) hours as needed for wheezing or shortness of breath. 08/30/13  Yes Barton Fanny, MD  Blood Pressure Monitoring (BLOOD PRESSURE MONITOR/WRIST) DEVI 1 Device by Does not apply route daily. 08/30/13  Yes Barton Fanny, MD  fluticasone [facility]) 50 MCG/ACT nasal spray Place 2 sprays into both nostrils daily. 08/30/13  Yes Barton Fanny, MD  furosemide (LASIX) 40 MG tablet Take 1 tablet (40 mg total) by mouth daily. 08/30/13  Yes Barton Fanny, MD  meloxicam (MOBIC) 15 MG tablet Take 1 tablet (15 mg total) by mouth daily. 03/02/13  Yes Barton Fanny, MD  simvastatin (ZOCOR) 80 MG tablet Take 1 tablet (80 mg total) by mouth daily. 08/30/13  Yes Barton Fanny, MD    ROS: As per HPI; negative for diaphoresis, CP or tightness, palpitations, cough, AH, dizziness, weakness or syncope.  O:  Filed Vitals:   09/27/13 0902  BP: 120/80  Pulse: 73  Temp: 98.2 F (36.8 C)  Resp: 16    GEN: In NAD: WN,WD. Weight down 18 lbs in 1 month. HENT: /AT; EOMI w/ clear conj/sclerae. Otherwise  unremarkable. LUNGS: CTA; no wheezes, rhonchi or rales. COR: RRR. No m/g/r. SKIN: W&D. MS: Trace nonpitting edema. NEURO: A&O x 3; Cns intact. Nonfocal.  A/P: Edema of both legs- Improved. No medication changes.  Obesity, morbid- Continue fitness and weight loss plan.  RTC in 6 months for [facility] Subsequent Annual CPE. Briefly discussed Prevnar13 and Flu vaccines.

## 2013-09-28 ENCOUNTER — Ambulatory Visit: Payer: Medicare HMO

## 2013-09-28 DIAGNOSIS — E291 Testicular hypofunction: Secondary | ICD-10-CM

## 2013-09-28 LAB — IBC PANEL
IRON: 72 ug/dL (ref 42–165)
Saturation Ratios: 23.5 % (ref 20.0–50.0)
TRANSFERRIN: 219 mg/dL (ref 212.0–360.0)

## 2013-10-03 ENCOUNTER — Encounter: Payer: Self-pay | Admitting: Endocrinology

## 2013-10-11 ENCOUNTER — Encounter (HOSPITAL_COMMUNITY): Payer: Self-pay | Admitting: Emergency Medicine

## 2013-10-11 ENCOUNTER — Emergency Department (HOSPITAL_COMMUNITY)
Admission: EM | Admit: 2013-10-11 | Discharge: 2013-10-11 | Disposition: A | Discharge status: Home / Self Care | Payer: Medicare HMO | Attending: Emergency Medicine | Admitting: Emergency Medicine

## 2013-10-11 ENCOUNTER — Emergency Department (HOSPITAL_COMMUNITY): Payer: Medicare HMO

## 2013-10-11 DIAGNOSIS — W108XXA Fall (on) (from) other stairs and steps, initial encounter: Secondary | ICD-10-CM | POA: Diagnosis not present

## 2013-10-11 DIAGNOSIS — Z87891 Personal history of nicotine dependence: Secondary | ICD-10-CM | POA: Diagnosis not present

## 2013-10-11 DIAGNOSIS — Z79899 Other long term (current) drug therapy: Secondary | ICD-10-CM | POA: Insufficient documentation

## 2013-10-11 DIAGNOSIS — S99919A Unspecified injury of unspecified ankle, initial encounter: Principal | ICD-10-CM

## 2013-10-11 DIAGNOSIS — Y92009 Unspecified place in unspecified non-institutional (private) residence as the place of occurrence of the external cause: Secondary | ICD-10-CM | POA: Insufficient documentation

## 2013-10-11 DIAGNOSIS — Z791 Long term (current) use of non-steroidal anti-inflammatories (NSAID): Secondary | ICD-10-CM | POA: Diagnosis not present

## 2013-10-11 DIAGNOSIS — M129 Arthropathy, unspecified: Secondary | ICD-10-CM | POA: Insufficient documentation

## 2013-10-11 DIAGNOSIS — IMO0002 Reserved for concepts with insufficient information to code with codable children: Secondary | ICD-10-CM | POA: Diagnosis not present

## 2013-10-11 DIAGNOSIS — S8990XA Unspecified injury of unspecified lower leg, initial encounter: Secondary | ICD-10-CM | POA: Diagnosis present

## 2013-10-11 DIAGNOSIS — E785 Hyperlipidemia, unspecified: Secondary | ICD-10-CM | POA: Diagnosis not present

## 2013-10-11 DIAGNOSIS — Z96659 Presence of unspecified artificial knee joint: Secondary | ICD-10-CM | POA: Insufficient documentation

## 2013-10-11 DIAGNOSIS — S99929A Unspecified injury of unspecified foot, initial encounter: Principal | ICD-10-CM

## 2013-10-11 DIAGNOSIS — Y9389 Activity, other specified: Secondary | ICD-10-CM | POA: Diagnosis not present

## 2013-10-11 DIAGNOSIS — M25562 Pain in left knee: Secondary | ICD-10-CM

## 2013-10-11 NOTE — ED Notes (Signed)
The pt is c/o rt knee pain after he stumbled and fell yesterday.  He is also c/o some lower back pain.  No loc

## 2013-10-11 NOTE — Discharge Instructions (Signed)

## 2013-10-11 NOTE — ED Provider Notes (Signed)
CSN: [id number]     Arrival date & time 10/11/13  1905 History  This chart was scribed for a non-physician practitioner, Noland Fordyce, PA-C working with Virgel Manifold, MD by Martinique Peace, ED Scribe. The patient was seen in [facility]. The patient's care was started at 8:43 PM.     Chief Complaint  Patient presents with  . Fall      Patient is a 58 y.o. male presenting with fall. The history is provided by the patient. No language interpreter was used.  Fall This is a new problem. The current episode started yesterday. Pertinent negatives include no chest pain, no abdominal pain, no headaches and no shortness of breath. The symptoms are aggravated by walking and standing.  HPI Comments: Nathan Fleming is a 58 y.o. male who presents to the Emergency Department complaining of fall occurred yesterday. He states that he fell sidways down about 8 or 9 carpeted steps at home.  He complains of "aching" pain specifically to left knee and reports history of knee replacement to same area as well. Pt rates pain as 4/10 currently but states it is exacerbated with standing and ambulation. Pt also complains of moderate lower back pain. Pt denies hitting his head or LOC at any point. Pt is former smoker.  Past Medical History  Diagnosis Date  . Allergy   . Hyperlipidemia   . Arthritis    Past Surgical History  Procedure Laterality Date  . Knee surgery    . Hip surgery    . Shoulder surgery     Family History  Problem Relation Age of Onset  . Diabetes Mother   . Stroke Father   . Diabetes Sister   . Thyroid disease Neg Hx    History  Substance Use Topics  . Smoking status: Former Smoker -- 0.25 packs/day for 10 years    Quit date: 01/19/1988  . Smokeless tobacco: Never Used  . Alcohol Use: No    Review of Systems  Respiratory: Negative for shortness of breath.   Cardiovascular: Negative for chest pain.  Gastrointestinal: Negative for abdominal pain.  Musculoskeletal: Positive for back  pain (lower).       Left knee pain.   Neurological: Negative for syncope and headaches.  All other systems reviewed and are negative.     Allergies  Review of patient's allergies indicates no known allergies.  Home Medications   Prior to Admission medications   Medication Sig Start Date End Date Taking? Authorizing Provider  albuterol (PROVENTIL HFA;VENTOLIN HFA) 108 (90 BASE) MCG/ACT inhaler Inhale 2 puffs into the lungs every 6 (six) hours as needed for wheezing or shortness of breath. 08/30/13  Yes Barton Fanny, MD  Blood Pressure Monitoring (BLOOD PRESSURE MONITOR/WRIST) DEVI 1 Device by Does not apply route daily. 08/30/13  Yes Barton Fanny, MD  fluticasone [facility]) 50 MCG/ACT nasal spray Place 2 sprays into both nostrils daily. 08/30/13  Yes Barton Fanny, MD  furosemide (LASIX) 40 MG tablet Take 1 tablet (40 mg total) by mouth daily. 08/30/13  Yes Barton Fanny, MD  meloxicam (MOBIC) 15 MG tablet Take 1 tablet (15 mg total) by mouth daily. 03/02/13  Yes Barton Fanny, MD  Menthol, Topical Analgesic, (BIOFREEZE EX) Apply 1 application topically daily as needed (knee pain).   Yes Historical Provider, MD  simvastatin (ZOCOR) 80 MG tablet Take 1 tablet (80 mg total) by mouth daily. 08/30/13  Yes Barton Fanny, MD  vitamin C (ASCORBIC ACID) 500 MG tablet  Take 1,000 mg by mouth every other day.   Yes Historical Provider, MD  Zinc 30 MG CAPS Take 1 capsule by mouth every other day.   Yes Historical Provider, MD   BP 154/75  Pulse 86  Temp(Src) 98.3 F (36.8 C) (Oral)  Resp 16  Ht 6\' 2"  (1.88 m)  Wt 336 lb (152.409 kg)  BMI 43.12 kg/m2  SpO2 97% Physical Exam  Nursing note and vitals reviewed. Constitutional: He is oriented to person, place, and time. He appears well-developed and well-nourished.  Pt ambulates with assistance from cane.   HENT:  Head: Normocephalic and atraumatic.  Eyes: EOM are normal.  Neck: Normal range of motion.   Cardiovascular: Normal rate.   Pulmonary/Chest: Effort normal.  Musculoskeletal: Normal range of motion. He exhibits tenderness.  Mild tenderness along medial aspect of left knee. Full ROM.   Neurological: He is alert and oriented to person, place, and time.  Skin: Skin is warm and dry.  Well healed surgical scar on left knee.   Psychiatric: He has a normal mood and affect. His behavior is normal.    ED Course  Procedures (including critical care time) Labs Review Labs Reviewed - No data to display  Results for orders placed in visit on 09/28/13  IBC PANEL      Result Value Ref Range   Iron 72  42 - 165 ug/dL   Transferrin 219.0  212.0 - 360.0 mg/dL   Saturation Ratios 23.5  20.0 - 50.0 %   No results found.    Imaging Review Dg Knee Complete 4 Views Left  10/11/2013   CLINICAL DATA:  [address] down stairs yesterday with left knee pain  EXAM: LEFT KNEE - COMPLETE 4+ VIEW  COMPARISON:  None.  FINDINGS: No fracture or dislocation. Tricompartmental knee prosthesis with components in anticipated position. No joint effusion.  IMPRESSION: No acute findings   Electronically Signed   By: Skipper Cliche M.D.   On: 10/11/2013 21:35     EKG Interpretation None     Medications - No data to display  8:46 PM- Treatment plan was discussed with patient who verbalizes understanding and agrees.   MDM   Final diagnoses:  Left knee pain  Fall down stairs, initial encounter    Pt with hx of left total knee replacement c/o left knee pain along medial aspect after fall down carpeted steps yesterday. Denies hitting head, LOC, or other injuries.  Pt is mildly tender along medial aspect left knee. FROM, neurovascularly in tact. Plain films: Normal. Will tx as sprain.  Encouraged to f/u with his orthopedist as needed. Pt verbalized understanding and agreement with tx plan.    I personally performed the services described in this documentation, which was scribed in my presence. The recorded  information has been reviewed and is accurate.    Noland Fordyce, PA-C 10/11/13 2252

## 2013-10-12 NOTE — ED Provider Notes (Signed)
Medical screening examination/treatment/procedure(s) were performed by non-physician practitioner and as supervising physician I was immediately available for consultation/collaboration.   EKG Interpretation None       Virgel Manifold, MD 10/12/13 1453

## 2013-10-17 ENCOUNTER — Ambulatory Visit (HOSPITAL_COMMUNITY): Payer: Medicare HMO

## 2013-10-17 ENCOUNTER — Encounter (HOSPITAL_COMMUNITY): Payer: Medicare HMO

## 2013-10-18 ENCOUNTER — Other Ambulatory Visit (HOSPITAL_COMMUNITY): Payer: Medicare HMO

## 2013-10-18 ENCOUNTER — Encounter (HOSPITAL_COMMUNITY): Payer: Medicare HMO

## 2013-10-29 ENCOUNTER — Ambulatory Visit (HOSPITAL_COMMUNITY)
Admission: RE | Admit: 2013-10-29 | Discharge: 2013-10-29 | Disposition: A | Discharge status: Home / Self Care | Payer: Medicare HMO | Source: Ambulatory Visit | Attending: Endocrinology | Admitting: Endocrinology

## 2013-10-29 DIAGNOSIS — E0590 Thyrotoxicosis, unspecified without thyrotoxic crisis or storm: Secondary | ICD-10-CM | POA: Insufficient documentation

## 2013-10-29 MED ORDER — SODIUM IODIDE I 131 CAPSULE
14.3000 | Freq: Once | INTRAVENOUS | Status: AC | PRN
  Administered 2013-10-29: 14.3 via ORAL

## 2013-10-30 ENCOUNTER — Encounter (HOSPITAL_COMMUNITY)
Admission: RE | Admit: 2013-10-30 | Discharge: 2013-10-30 | Disposition: A | Discharge status: Home / Self Care | Payer: Medicare HMO | Source: Ambulatory Visit | Attending: Endocrinology | Admitting: Endocrinology

## 2013-10-30 DIAGNOSIS — E0590 Thyrotoxicosis, unspecified without thyrotoxic crisis or storm: Secondary | ICD-10-CM | POA: Diagnosis present

## 2013-10-30 MED ORDER — SODIUM PERTECHNETATE TC 99M INJECTION
10.0000 | Freq: Once | INTRAVENOUS | Status: AC | PRN
  Administered 2013-10-30: 10 via INTRAVENOUS

## 2013-10-31 ENCOUNTER — Telehealth: Payer: Self-pay

## 2013-10-31 ENCOUNTER — Telehealth: Payer: Self-pay | Admitting: Endocrinology

## 2013-10-31 NOTE — Telephone Encounter (Signed)
Pt has lab results that we do not have in our EMR. Pt is going to get results sent over to [place]. He is concerned about taking a "radioactive medication" when he lab results show all  With in normal range- was unable to advise him since we do not have these results to view.

## 2013-10-31 NOTE — Telephone Encounter (Signed)
Patient received  mychart results  and would like to discuss with Dr. Cordelia Pen nurse about them   Please advise patient    Thank you

## 2013-10-31 NOTE — Telephone Encounter (Signed)
Patient reviewed his lab results from his mychart that were done by DR. Gar Ponto. Per patient his levels were normal and does not understand why his thyroid DR. Loanne Drilling is requesting him to have Iodine treatment. Patient is requesting to speak with Dr. Leward Quan to her opinion. Patients call back number is [phone]

## 2013-10-31 NOTE — Telephone Encounter (Signed)
Contact pt. Pt states that he viewed the mychart message about his uptake scan. Pt wanted to know why he should proceed with the radioactive iodine treatment. Pt reviewed report and saw under findings and impression the report said normal. Pt wanted to follow up and would like clarification as to why we need to proceed with radioactive iodine.  Please advise, Thanks!

## 2013-10-31 NOTE — Telephone Encounter (Signed)
The "normal just refers to the amount of iodine the thyroid is collecting.  It doesn't refute the abnormal blood tests.

## 2013-11-01 NOTE — Telephone Encounter (Signed)
Pt advised and voiced understanding.   

## 2013-11-01 NOTE — Telephone Encounter (Signed)
ok 

## 2013-11-01 NOTE — Telephone Encounter (Signed)
Pt advised of note below. Pt wanted me to ask if it would be ok if his wife took him home after the RAI because he does not drive.  Please advise, Thanks!

## 2013-11-07 ENCOUNTER — Encounter: Payer: Self-pay | Admitting: Endocrinology

## 2013-11-07 ENCOUNTER — Ambulatory Visit (INDEPENDENT_AMBULATORY_CARE_PROVIDER_SITE_OTHER): Payer: Medicare HMO | Admitting: Endocrinology

## 2013-11-07 VITALS — BP 136/87 | HR 71 | Temp 97.9°F | Ht 74.0 in | Wt 347.0 lb

## 2013-11-07 DIAGNOSIS — E0590 Thyrotoxicosis, unspecified without thyrotoxic crisis or storm: Secondary | ICD-10-CM

## 2013-11-07 NOTE — Progress Notes (Signed)
Subjective:    Patient ID: Nathan Fleming, male    DOB: 1955/10/23, 58 y.o.   MRN: [id number]  HPI Pt returns for f/u of hyperthyroidism (was dx'ed on routine labs in early 2015).  Pt is here to discuss I-131 rx.  pt states he feels well in general. Past Medical History  Diagnosis Date  . Allergy   . Hyperlipidemia   . Arthritis     Past Surgical History  Procedure Laterality Date  . Knee surgery    . Hip surgery    . Shoulder surgery      History   Social History  . Marital Status: Married    Spouse Name: N/A    Number of Children: N/A  . Years of Education: N/A   Occupational History  . retired    Social History Main Topics  . Smoking status: Former Smoker -- 0.25 packs/day for 10 years    Quit date: 01/19/1988  . Smokeless tobacco: Never Used  . Alcohol Use: No  . Drug Use: No  . Sexual Activity: Yes   Other Topics Concern  . Not on file   Social History Narrative   Married;   Moved from [place] in 2014   Retired   1 son    Current Outpatient Prescriptions on File Prior to Visit  Medication Sig Dispense Refill  . albuterol (PROVENTIL HFA;VENTOLIN HFA) 108 (90 BASE) MCG/ACT inhaler Inhale 2 puffs into the lungs every 6 (six) hours as needed for wheezing or shortness of breath.  1 Inhaler  1  . Blood Pressure Monitoring (BLOOD PRESSURE MONITOR/WRIST) DEVI 1 Device by Does not apply route daily.  1 Device  0  . fluticasone (FLONASE) 50 MCG/ACT nasal spray Place 2 sprays into both nostrils daily.  16 g  5  . furosemide (LASIX) 40 MG tablet Take 1 tablet (40 mg total) by mouth daily.  30 tablet  5  . meloxicam (MOBIC) 15 MG tablet Take 1 tablet (15 mg total) by mouth daily.  30 tablet  5  . Menthol, Topical Analgesic, (BIOFREEZE EX) Apply 1 application topically daily as needed (knee pain).      . simvastatin (ZOCOR) 80 MG tablet Take 1 tablet (80 mg total) by mouth daily.  30 tablet  5  . vitamin C (ASCORBIC ACID) 500 MG tablet Take 1,000 mg by mouth every other  day.      . Zinc 30 MG CAPS Take 1 capsule by mouth every other day.       No current facility-administered medications on file prior to visit.    No Known Allergies  Family History  Problem Relation Age of Onset  . Diabetes Mother   . Stroke Father   . Diabetes Sister   . Thyroid disease Neg Hx     BP 136/87  Pulse 71  Temp(Src) 97.9 F (36.6 C) (Oral)  Ht 6\' 2"  (1.88 m)  Wt 347 lb (157.398 kg)  BMI 44.53 kg/m2  SpO2 95%    Review of Systems He denies tremor    Objective:   Physical Exam VITAL SIGNS:  See vs page GENERAL: no distress Skin: not diaphoretic Neuro: no tremor.    Radiol: we reviewed nuclear med thyroid scan together.    Assessment & Plan:  Hyperthyroidism, due to Grave's dz.  Patient is advised the following: Patient Instructions  I have ordered for you a treatment pill of radioactive iodine.  Although it is a larger amount of radiation, you will again  notice no symptoms from this.  The pill is gone from your body in a few days (during which you should stay away from other people), but takes several months to work.  Therefore, please return here approximately 6-8 weeks after the treatment.  This treatment has been available for many years, and the only known side-effect is an underactive thyroid.  It is possible that i would eventually prescribe for you a thyroid hormone pill, which is very inexpensive.  You don't have to worry about side-effects of this thyroid hormone pill, because it is the same molecule your thyroid makes.

## 2013-11-07 NOTE — Patient Instructions (Signed)
I have ordered for you a treatment pill of radioactive iodine.  Although it is a larger amount of radiation, you will again notice no symptoms from this.  The pill is gone from your body in a few days (during which you should stay away from other people), but takes several months to work.  Therefore, please return here approximately 6-8 weeks after the treatment.  This treatment has been available for many years, and the only known side-effect is an underactive thyroid.  It is possible that i would eventually prescribe for you a thyroid hormone pill, which is very inexpensive.  You don't have to worry about side-effects of this thyroid hormone pill, because it is the same molecule your thyroid makes.   

## 2014-01-03 ENCOUNTER — Encounter (HOSPITAL_COMMUNITY): Payer: Medicare HMO | Attending: Endocrinology

## 2014-03-28 ENCOUNTER — Ambulatory Visit: Payer: Medicare HMO | Admitting: Family Medicine

## 2014-08-14 ENCOUNTER — Encounter: Payer: Self-pay | Admitting: *Deleted

## 2014-08-20 DIAGNOSIS — J309 Allergic rhinitis, unspecified: Secondary | ICD-10-CM | POA: Insufficient documentation

## 2014-08-20 DIAGNOSIS — R609 Edema, unspecified: Secondary | ICD-10-CM | POA: Insufficient documentation

## 2014-09-09 ENCOUNTER — Encounter: Payer: Self-pay | Admitting: Endocrinology

## 2014-09-09 ENCOUNTER — Ambulatory Visit (INDEPENDENT_AMBULATORY_CARE_PROVIDER_SITE_OTHER): Payer: Medicare HMO | Admitting: Endocrinology

## 2014-09-09 VITALS — BP 138/87 | HR 90 | Temp 98.4°F | Ht 74.0 in | Wt 375.0 lb

## 2014-09-09 DIAGNOSIS — E0590 Thyrotoxicosis, unspecified without thyrotoxic crisis or storm: Secondary | ICD-10-CM

## 2014-09-09 NOTE — Progress Notes (Signed)
   Subjective:    Patient ID: Nathan Fleming, male    DOB: 17-Dec-1955, 58 y.o.   MRN: [id number]  HPI Pt returns for f/u of hyperthyroidism (was dx'ed on routine labs in early 2015; in late 2015, she had nuc med study showing Grave's dz, but she did not have I-131 rx).  Pt now says he wants to go ahead with  I-131 rx.  pt states he feels well in general.     Review of Systems Denies fever/tremor/palpitations    Objective:   Physical Exam VITAL SIGNS:  See vs page GENERAL: no distress eyes: no periorbital swelling, no proptosis NECK: There is no palpable thyroid enlargement.  No thyroid nodule is palpable.  No palpable lymphadenopathy at the anterior neck.  Skin: not diaphoretic.  Neuro: no tremor.  Ext: 1+ bilat leg edema. Gait: steady with a cane.    outside test results are reviewed: TSH=0.128  I have reviewed outside records: she was seen, and they discussed I-131 rx.  Pt wants to proceed now.     Assessment & Plan:  Hyperthyroidism, persistent. We discussed again, and pt wants to take I-131 rx.  Patient is advised the following: Patient Instructions  I have ordered for you a treatment pill of radioactive iodine.  Although it is a larger amount of radiation, you will again notice no symptoms from this.  The pill is gone from your body in a few days (during which you should stay away from other people), but takes several months to work.  Therefore, please return here approximately 6-8 weeks after the treatment.  This treatment has been available for many years, and the only known side-effect is an underactive thyroid.  It is possible that i would eventually prescribe for you a thyroid hormone pill, which is very inexpensive.  You don't have to worry about side-effects of this thyroid hormone pill, because it is the same molecule your thyroid makes.

## 2014-09-09 NOTE — Patient Instructions (Signed)
I have ordered for you a treatment pill of radioactive iodine.  Although it is a larger amount of radiation, you will again notice no symptoms from this.  The pill is gone from your body in a few days (during which you should stay away from other people), but takes several months to work.  Therefore, please return here approximately 6-8 weeks after the treatment.  This treatment has been available for many years, and the only known side-effect is an underactive thyroid.  It is possible that i would eventually prescribe for you a thyroid hormone pill, which is very inexpensive.  You don't have to worry about side-effects of this thyroid hormone pill, because it is the same molecule your thyroid makes.

## 2014-09-10 ENCOUNTER — Other Ambulatory Visit: Payer: Self-pay | Admitting: Endocrinology

## 2014-09-10 DIAGNOSIS — E0590 Thyrotoxicosis, unspecified without thyrotoxic crisis or storm: Secondary | ICD-10-CM

## 2014-09-19 ENCOUNTER — Ambulatory Visit (HOSPITAL_COMMUNITY): Payer: Medicare HMO

## 2014-09-20 ENCOUNTER — Other Ambulatory Visit (HOSPITAL_COMMUNITY): Payer: Medicare HMO

## 2014-09-24 ENCOUNTER — Ambulatory Visit (HOSPITAL_COMMUNITY): Payer: Medicare HMO

## 2014-09-25 ENCOUNTER — Ambulatory Visit (HOSPITAL_COMMUNITY): Payer: Medicare HMO

## 2014-09-25 ENCOUNTER — Other Ambulatory Visit (HOSPITAL_COMMUNITY): Payer: Medicare HMO

## 2014-09-30 ENCOUNTER — Encounter (HOSPITAL_COMMUNITY): Admission: RE | Admit: 2014-09-30 | Payer: Medicare HMO | Source: Ambulatory Visit

## 2014-10-01 ENCOUNTER — Other Ambulatory Visit (HOSPITAL_COMMUNITY): Payer: Medicare HMO

## 2014-10-01 DIAGNOSIS — R030 Elevated blood-pressure reading, without diagnosis of hypertension: Secondary | ICD-10-CM | POA: Insufficient documentation

## 2014-10-01 DIAGNOSIS — M545 Low back pain, unspecified: Secondary | ICD-10-CM | POA: Insufficient documentation

## 2014-10-04 ENCOUNTER — Ambulatory Visit (HOSPITAL_COMMUNITY): Payer: Medicare HMO

## 2014-10-28 ENCOUNTER — Telehealth: Payer: Self-pay | Admitting: Endocrinology

## 2014-10-28 NOTE — Telephone Encounter (Signed)
Pt calling to make appt for uptake and scan urgently please

## 2014-10-30 NOTE — Telephone Encounter (Signed)
Awaiting pt to call back.

## 2015-12-10 ENCOUNTER — Encounter: Payer: Self-pay | Admitting: Physician Assistant

## 2015-12-10 ENCOUNTER — Ambulatory Visit (INDEPENDENT_AMBULATORY_CARE_PROVIDER_SITE_OTHER): Payer: Medicare HMO | Admitting: Physician Assistant

## 2015-12-10 VITALS — BP 160/98 | HR 83 | Resp 17 | Ht 73.0 in | Wt 370.0 lb

## 2015-12-10 DIAGNOSIS — L02821 Furuncle of head [any part, except face]: Secondary | ICD-10-CM

## 2015-12-10 DIAGNOSIS — L0291 Cutaneous abscess, unspecified: Secondary | ICD-10-CM | POA: Diagnosis not present

## 2015-12-10 MED ORDER — DOXYCYCLINE HYCLATE 100 MG PO CAPS: 100.0000 mg | ORAL_CAPSULE | Freq: Two times a day (BID) | ORAL | 0 refills | Status: DC

## 2015-12-10 NOTE — Progress Notes (Signed)
Nathan Fleming  MRN: [id number] DOB: 09-11-1955  PCP: Bartholome Bill, MD  Subjective:  Pt is a 59 year old male, history of HYN, HLD, morbid obesity, who presents to clinic for bug bite on head.  He woke up three days ago and noticed a mass on the right side of the top of his head and figured it was a bug bite. It was large and tender to touch. Over the past few days it has gotten smaller in size but is increasing in pain. He has applied warm, wet paper towels on it, and states it has been draining. Also used hydrogen peroxide.  He hasn't slept the past two nights due to pain on his scalp.  Denies fever, chills, chest pain, chest pressure, palpitations, headaches.  He notes he occasionally gets bumps like this on his body, usually after he drinks soda. They usually get better on their own.   Review of Systems  Constitutional: Negative for chills, diaphoresis and fever.  Respiratory: Negative for cough, chest tightness, shortness of breath and wheezing.   Cardiovascular: Negative for chest pain, palpitations and leg swelling.  Gastrointestinal: Negative for diarrhea, nausea and vomiting.  Skin: Positive for wound.  Neurological: Negative for dizziness, syncope, light-headedness and headaches.  Psychiatric/Behavioral: Positive for sleep disturbance. The patient is not nervous/anxious.     Patient Active Problem List   Diagnosis Date Noted  . Hyperthyroidism 09/26/2013  . Other testicular hypofunction 09/26/2013  . Erectile dysfunction 08/30/2013  . Edema of both legs 03/04/2013  . Dyslipidemia 03/04/2013  . Obesity, morbid ([place]) 03/04/2013    Current Outpatient Prescriptions on File Prior to Visit  Medication Sig Dispense Refill  . albuterol (PROVENTIL HFA;VENTOLIN HFA) 108 (90 BASE) MCG/ACT inhaler Inhale 2 puffs into the lungs every 6 (six) hours as needed for wheezing or shortness of breath. 1 Inhaler 1  . Blood Pressure Monitoring (BLOOD PRESSURE MONITOR/WRIST) DEVI 1  Device by Does not apply route daily. 1 Device 0  . fluticasone (FLONASE) 50 MCG/ACT nasal spray Place 2 sprays into both nostrils daily. 16 g 5  . furosemide (LASIX) 40 MG tablet Take 1 tablet (40 mg total) by mouth daily. 30 tablet 5  . meloxicam (MOBIC) 15 MG tablet Take 1 tablet (15 mg total) by mouth daily. 30 tablet 5  . Menthol, Topical Analgesic, (BIOFREEZE EX) Apply 1 application topically daily as needed (knee pain).    . simvastatin (ZOCOR) 80 MG tablet Take 1 tablet (80 mg total) by mouth daily. 30 tablet 5  . vitamin C (ASCORBIC ACID) 500 MG tablet Take 1,000 mg by mouth every other day.    . Zinc 30 MG CAPS Take 1 capsule by mouth every other day.     No current facility-administered medications on file prior to visit.     No Known Allergies   Objective:  BP (!) 160/98 (BP Location: Right Arm, Patient Position: Sitting, Cuff Size: Large)   Pulse 83   Resp 17   Ht 6\' 1"  (1.854 m)   Wt (!) 370 lb (167.8 kg)   SpO2 97%   BMI 48.82 kg/m   Physical Exam  Constitutional: He is oriented to person, place, and time and well-developed, well-nourished, and in no distress. No distress.  Cardiovascular: Normal rate, regular rhythm and normal heart sounds.   Pulmonary/Chest: Effort normal. No respiratory distress.  Neurological: He is alert and oriented to person, place, and time. GCS score is 15.  Skin: Skin is warm and dry.  Psychiatric: Mood, memory, affect and judgment normal.  Vitals reviewed.  Procedure: Verbal consent obtained. Skin was cleaned with betadine and anesthetized with 3cc Bupivacaine. A moderate amount of crusted drainage was cleaned sloughed from the surface, along with a moderate amount of purulence.  Once debris was cleared from the surface, a 1 cm erythematous fluctuant nodule was revealed, surrounded by a 4 cm x 3 cm area of multiple fluctuant nodules with pustular heads. A 1cm incision was made. A moderate amount of purulent material expressed, draining  via multiple pustules. Wound cavity size explored with curved hemostats and estimated to be about 2 cm posteriorly. Cystic material discovered with deeper exploration, material removed but not completely. Wound irrigated with 10 cc saline and packed with 1/4 inch packing. Wound dressed and wound care discussed.  Assessment and Plan :  1. Boil of head or scalp 2. Abscess - doxycycline (VIBRAMYCIN) 100 MG capsule; Take 1 capsule (100 mg total) by mouth 2 (two) times daily.  Dispense: 20 capsule; Refill: 0 - WOUND CULTURE - Pseudofolliculitis barbae vs infected sebaceous cyst. Wound care discussed with patient. He lives alone and believes he will have a difficult time keeping wound covered, advised putting gauze underneath hat. Wash with soap and water only, do not use hydrogen peroxide. Apply warm compressed to the area several times a day. RTC in 48 hours for wound check.  - Wound culture pending  Mercer Pod, PA-C  [facility] 12/10/2015 3:52 PM

## 2015-12-10 NOTE — Patient Instructions (Addendum)
Please start your antibiotic tonight. Take the entire course, even if you start to feel better. You can take ibuprofen or tylenol for pain.  Please use warm compresses on your head several times a day. You can warm up a wet washcloth in the microwave and place it in a ziploc bag, or a sock filled with rice and heat that in a microwave.  Please come back on Friday and follow-up with Daphane Shepherd.   Thank you for coming in today. I hope you feel we met your needs.  Feel free to call [facility] if you have any questions or further requests.  Please consider signing up for MyChart if you do not already have it, as this is a great way to communicate with me.  Best,  Whitney McVey, PA-C    IF you received an x-ray today, you will receive an invoice from [facility]. Please contact [facility] at [phone] with questions or concerns regarding your invoice.   IF you received labwork today, you will receive an invoice from [employer]. Please contact [facility] at [phone] with questions or concerns regarding your invoice.   Our billing staff will not be able to assist you with questions regarding bills from these companies.  You will be contacted with the lab results as soon as they are available. The fastest way to get your results is to activate your My Chart account. Instructions are located on the last page of this paperwork. If you have not heard from [place] regarding the results in 2 weeks, please contact this office.

## 2015-12-12 ENCOUNTER — Ambulatory Visit (INDEPENDENT_AMBULATORY_CARE_PROVIDER_SITE_OTHER): Payer: Medicare HMO | Admitting: Physician Assistant

## 2015-12-12 VITALS — BP 150/90 | HR 72 | Temp 98.3°F | Resp 18 | Ht 73.0 in | Wt 370.4 lb

## 2015-12-12 DIAGNOSIS — L02811 Cutaneous abscess of head [any part, except face]: Secondary | ICD-10-CM

## 2015-12-12 NOTE — Telephone Encounter (Signed)
Pt has appt scheduled for f/up wound care today. He will get instructions then depending upon status of wound.

## 2015-12-12 NOTE — Patient Instructions (Signed)
     IF you received an x-ray today, you will receive an invoice from [facility]. Please contact [facility] at [phone] with questions or concerns regarding your invoice.   IF you received labwork today, you will receive an invoice from [facility]. Please contact [facility] at [phone] with questions or concerns regarding your invoice.   Our billing staff will not be able to assist you with questions regarding bills from these companies.  You will be contacted with the lab results as soon as they are available. The fastest way to get your results is to activate your My Chart account. Instructions are located on the last page of this paperwork. If you have not heard from us regarding the results in 2 weeks, please contact this office.      

## 2015-12-12 NOTE — Progress Notes (Signed)
Chief Complaint  Patient presents with  . Follow-up    recheck abcess    History of Present Illness: Patient presents for wound care.  He was initially seen 12/10/2015 for what he thought was an infected insect bite. There was a large amount of crusted material overlying an abscess on the RIGHT posterior scalp. I&D was performed resulting in large purulence, and then gray sebaceous material. He was started on doxycycline and presents today for wound care.  He reports reduced pain and tolerance to the medication.   No Known Allergies  Prior to Admission medications   Medication Sig Start Date End Date Taking? Authorizing Provider  albuterol (PROVENTIL HFA;VENTOLIN HFA) 108 (90 BASE) MCG/ACT inhaler Inhale 2 puffs into the lungs every 6 (six) hours as needed for wheezing or shortness of breath. 08/30/13  Yes Barton Fanny, MD  Blood Pressure Monitoring (BLOOD PRESSURE MONITOR/WRIST) DEVI 1 Device by Does not apply route daily. 08/30/13  Yes Barton Fanny, MD  doxycycline (VIBRAMYCIN) 100 MG capsule Take 1 capsule (100 mg total) by mouth 2 (two) times daily. 12/10/15  Yes Elizabeth Whitney McVey, PA-C  fluticasone (FLONASE) 50 MCG/ACT nasal spray Place 2 sprays into both nostrils daily. 08/30/13  Yes Barton Fanny, MD  furosemide (LASIX) 40 MG tablet Take 1 tablet (40 mg total) by mouth daily. 08/30/13  Yes Barton Fanny, MD  meloxicam (MOBIC) 15 MG tablet Take 1 tablet (15 mg total) by mouth daily. 03/02/13  Yes Barton Fanny, MD  Menthol, Topical Analgesic, (BIOFREEZE EX) Apply 1 application topically daily as needed (knee pain).   Yes Historical Provider, MD  simvastatin (ZOCOR) 80 MG tablet Take 1 tablet (80 mg total) by mouth daily. 08/30/13  Yes Barton Fanny, MD  vitamin C (ASCORBIC ACID) 500 MG tablet Take 1,000 mg by mouth every other day.   Yes Historical Provider, MD  Zinc 30 MG CAPS Take 1 capsule by mouth every other day.   Yes Historical Provider,  MD    Patient Active Problem List   Diagnosis Date Noted  . Hyperthyroidism 09/26/2013  . Other testicular hypofunction 09/26/2013  . Erectile dysfunction 08/30/2013  . Edema of both legs 03/04/2013  . Dyslipidemia 03/04/2013  . Obesity, morbid ([place]) 03/04/2013     Physical Exam  Constitutional: He is oriented to person, place, and time. He appears well-developed and well-nourished. He is active and cooperative. No distress.  BP (!) 150/90 (BP Location: Right Arm, Patient Position: Sitting, Cuff Size: Large)   Pulse 72   Temp 98.3 F (36.8 C) (Oral)   Resp 18   Ht 6\' 1"  (1.854 m)   Wt (!) 370 lb 6.4 oz (168 kg)   SpO2 95%   BMI 48.87 kg/m    HENT:  Head:    Eyes: Conjunctivae are normal.  Pulmonary/Chest: Effort normal.  Neurological: He is alert and oriented to person, place, and time.  Psychiatric: He has a normal mood and affect. His speech is normal and behavior is normal.      ASSESSMENT & PLAN:  1. Scalp abscess Continue current treatment.  Warm compresses. Gently wash scalp daily with soap and water to minimize crusting of drainage in hair, careful not to remove the packing.  RTC Monday 11/27 for re-evaluation.   Fara Chute, PA-C Physician Assistant-Certified [facility]

## 2015-12-13 ENCOUNTER — Ambulatory Visit (INDEPENDENT_AMBULATORY_CARE_PROVIDER_SITE_OTHER): Payer: Medicare HMO | Admitting: Physician Assistant

## 2015-12-13 VITALS — BP 142/80 | HR 101 | Temp 98.4°F | Resp 18 | Ht 73.0 in

## 2015-12-13 DIAGNOSIS — Z4889 Encounter for other specified surgical aftercare: Secondary | ICD-10-CM

## 2015-12-13 NOTE — Progress Notes (Signed)
   12/13/2015 2:19 PM   DOB: [birth year] / MRN: [id number]  SUBJECTIVE:  Mr. Noel is a well appearing 60 y.o. here today for wound care. He denies exquisite tenderness at the site of the wound, nausea, emesis, fever and chills.  He has been compliant with medical therapy and recommendations thus far.   He has No Known Allergies.   He  has a past medical history of Allergy; Arthritis; and Hyperlipidemia.    He  reports that he quit smoking about 27 years ago. He has a 2.50 pack-year smoking history. He has never used smokeless tobacco. He reports that he does not drink alcohol or use drugs. He  reports that he currently engages in sexual activity. The patient  has a past surgical history that includes Knee surgery; Hip surgery; and Shoulder surgery.  His family history includes Diabetes in his mother and sister; Stroke in his father.  ROS  Per HPI  Problem list and medications reviewed and updated by myself where necessary, and exist elsewhere in the encounter.   OBJECTIVE:  BP (!) 142/80 (BP Location: Right Arm, Patient Position: Sitting, Cuff Size: Large)   Pulse (!) 101   Temp 98.4 F (36.9 C) (Oral)   Resp 18   Ht 6\' 1"  (1.854 m)   SpO2 98%   BMI 48.87 kg/m  CrCl cannot be calculated (Patient's most recent lab result is older than the maximum 21 days allowed.).  Physical Exam  Constitutional: He is oriented to person, place, and time. He appears well-developed and well-nourished.  HENT:  Head:    Cardiovascular: Regular rhythm and normal heart sounds.   Pulmonary/Chest: Effort normal and breath sounds normal.  Musculoskeletal: Normal range of motion.  Neurological: He is alert and oriented to person, place, and time.  Skin: Skin is warm and dry.    No results found for this or any previous visit (from the past 48 hour(s)).  ASSESSMENT AND PLAN  Anne was seen today for suture / staple removal.  Diagnoses and all orders for this visit:  Encounter for  postoperative wound care Comments: Cyst sac removed.  Advised he pull packing in 2 days and RTC as needed. Continue ABX.     The patient was advised to call or return to clinic if he does not see an improvement in symptoms or to seek the care of the closest emergency department if he worsens with the above plan.   Philis Fendt, MHS, PA-C [facility] 12/13/2015 2:19 PM

## 2015-12-13 NOTE — Patient Instructions (Signed)
     IF you received an x-ray today, you will receive an invoice from  Radiology. Please contact  Radiology at [phone] with questions or concerns regarding your invoice.   IF you received labwork today, you will receive an invoice from [facility]. Please contact [facility] at [phone] with questions or concerns regarding your invoice.   Our billing staff will not be able to assist you with questions regarding bills from these companies.  You will be contacted with the lab results as soon as they are available. The fastest way to get your results is to activate your My Chart account. Instructions are located on the last page of this paperwork. If you have not heard from us regarding the results in 2 weeks, please contact this office.      

## 2015-12-14 LAB — WOUND CULTURE: Organism ID, Bacteria: NORMAL

## 2015-12-15 ENCOUNTER — Ambulatory Visit: Payer: Medicare HMO

## 2015-12-15 ENCOUNTER — Encounter: Payer: Self-pay | Admitting: Physician Assistant

## 2015-12-16 ENCOUNTER — Encounter: Payer: Self-pay | Admitting: Physician Assistant

## 2015-12-16 ENCOUNTER — Ambulatory Visit (INDEPENDENT_AMBULATORY_CARE_PROVIDER_SITE_OTHER): Payer: Medicare HMO | Admitting: Physician Assistant

## 2015-12-16 VITALS — BP 138/86 | HR 93 | Temp 98.1°F | Resp 18 | Wt 378.6 lb

## 2015-12-16 DIAGNOSIS — Z4889 Encounter for other specified surgical aftercare: Secondary | ICD-10-CM

## 2015-12-16 NOTE — Progress Notes (Signed)
   12/16/2015 2:34 PM   DOB: 1955-06-20 / MRN: [id number]  SUBJECTIVE:  Nathan Fleming is a 60 y.o. male presenting for right sided scalp sebaceous cyst follow up.  He reports doing well despite copious drainage.    He has No Known Allergies.   He  has a past medical history of Allergy; Arthritis; and Hyperlipidemia.    He  reports that he quit smoking about 27 years ago. He has a 2.50 pack-year smoking history. He has never used smokeless tobacco. He reports that he does not drink alcohol or use drugs. He  reports that he currently engages in sexual activity. The patient  has a past surgical history that includes Knee surgery; Hip surgery; and Shoulder surgery.  His family history includes Diabetes in his mother and sister; Stroke in his father.  Review of Systems  Constitutional: Negative for fever.  Gastrointestinal: Negative for nausea.  Skin: Negative for itching.  Neurological: Negative for dizziness.    The problem list and medications were reviewed and updated by myself where necessary and exist elsewhere in the encounter.   OBJECTIVE:  BP 138/86 (BP Location: Right Arm, Cuff Size: Large)   Pulse 93   Temp 98.1 F (36.7 C) (Oral)   Resp 18   Wt (!) 378 lb 9.6 oz (171.7 kg)   SpO2 95%   BMI 49.95 kg/m   Physical Exam  Constitutional: He appears well-developed and well-nourished.  HENT:  Head:    Cardiovascular: Normal rate.   Pulmonary/Chest: Effort normal and breath sounds normal.  Musculoskeletal: Normal range of motion.    No results found for this or any previous visit (from the past 72 hour(s)).  No results found.  ASSESSMENT AND PLAN  Twan was seen today for wound check.  Diagnoses and all orders for this visit:  Encounter for postoperative wound care: RTC in 48 hours for further follow up.     The patient is advised to call or return to clinic if he does not see an improvement in symptoms, or to seek the care of the closest emergency  department if he worsens with the above plan.   Philis Fendt, MHS, PA-C [facility] 12/16/2015 2:34 PM

## 2015-12-16 NOTE — Patient Instructions (Signed)
     IF you received an x-ray today, you will receive an invoice from [facility]. Please contact [facility] at [phone] with questions or concerns regarding your invoice.   IF you received labwork today, you will receive an invoice from [facility]. Please contact [facility] at [phone] with questions or concerns regarding your invoice.   Our billing staff will not be able to assist you with questions regarding bills from these companies.  You will be contacted with the lab results as soon as they are available. The fastest way to get your results is to activate your My Chart account. Instructions are located on the last page of this paperwork. If you have not heard from us regarding the results in 2 weeks, please contact this office.      

## 2015-12-17 NOTE — Telephone Encounter (Signed)
Pt came back in for re-check yesterday.

## 2015-12-18 ENCOUNTER — Ambulatory Visit (INDEPENDENT_AMBULATORY_CARE_PROVIDER_SITE_OTHER): Payer: Medicare HMO | Admitting: Physician Assistant

## 2015-12-18 DIAGNOSIS — Z4889 Encounter for other specified surgical aftercare: Secondary | ICD-10-CM

## 2015-12-18 NOTE — Progress Notes (Signed)
   12/18/2015 2:03 PM   DOB: [birth year] / MRN: [id number]  SUBJECTIVE:  Nathan Fleming is a 60 y.o. male presenting for right sided scalp sebaceous cyst follow up.  He reports doing well despite copious drainage.    He has No Known Allergies.   He  has a past medical history of Allergy; Arthritis; and Hyperlipidemia.    He  reports that he quit smoking about 27 years ago. He has a 2.50 pack-year smoking history. He has never used smokeless tobacco. He reports that he does not drink alcohol or use drugs. He  reports that he currently engages in sexual activity. The patient  has a past surgical history that includes Knee surgery; Hip surgery; and Shoulder surgery.  His family history includes Diabetes in his mother and sister; Stroke in his father.  Review of Systems  Constitutional: Negative for fever.  Gastrointestinal: Negative for nausea.  Skin: Negative for itching.  Neurological: Negative for dizziness.    The problem list and medications were reviewed and updated by myself where necessary and exist elsewhere in the encounter.   OBJECTIVE:  There were no vitals taken for this visit.  Physical Exam  Constitutional: He appears well-developed and well-nourished.  HENT:  Head:    Cardiovascular: Normal rate.   Pulmonary/Chest: Effort normal and breath sounds normal.  Musculoskeletal: Normal range of motion.    No results found for this or any previous visit (from the past 72 hour(s)).  No results found.  ASSESSMENT AND PLAN  Nathan Fleming was seen today for wound check.  Diagnoses and all orders for this visit:  Encounter for postoperative wound care: I am leaving his wound open and allowing it to heal.  RTC as needed.     The patient is advised to call or return to clinic if he does not see an improvement in symptoms, or to seek the care of the closest emergency department if he worsens with the above plan.   Philis Fendt, MHS, PA-C [facility] 12/18/2015 2:03 PM

## 2015-12-23 ENCOUNTER — Ambulatory Visit: Payer: Medicare HMO

## 2015-12-29 ENCOUNTER — Telehealth: Payer: Self-pay

## 2015-12-29 NOTE — Telephone Encounter (Signed)
PATIENT STATES HE SAW Nathan Fleming ABOUT 2 WEEKS AGO FOR A CYST ON THE (R) SIDE OF HIS HEAD. HE DID NOT NEED TO GET ANY STITCHES. ABOUT 2 DAYS AGO HE HAS NOTICED IT HAS A SCAB ON IT AND IT FEELS VERY HARD. SHOULD HE RETURN TO BE RECHECKED OR JUST LEAVE IT ALONE? BEST PHONE [phone] (CELL)  [place]

## 2016-01-01 NOTE — Telephone Encounter (Signed)
Pt states scab fell off Advised to continue using wash with soap/water and apply Neosporin  And to call clinic if redness, pus drainage develop

## 2016-01-03 ENCOUNTER — Ambulatory Visit (INDEPENDENT_AMBULATORY_CARE_PROVIDER_SITE_OTHER): Payer: Medicare HMO | Admitting: Physician Assistant

## 2016-01-03 VITALS — BP 126/88 | HR 87 | Temp 98.3°F | Resp 18 | Ht 73.0 in | Wt 372.0 lb

## 2016-01-03 DIAGNOSIS — Z23 Encounter for immunization: Secondary | ICD-10-CM | POA: Diagnosis not present

## 2016-01-03 DIAGNOSIS — E785 Hyperlipidemia, unspecified: Secondary | ICD-10-CM

## 2016-01-03 MED ORDER — SIMVASTATIN 80 MG PO TABS: 80.0000 mg | ORAL_TABLET | Freq: Every day | ORAL | 5 refills | Status: DC

## 2016-01-03 NOTE — Progress Notes (Signed)
01/03/2016 8:23 AM   DOB: February 21, 1955 / MRN: [id number]  SUBJECTIVE:  Nathan Fleming is a 60 y.o. male presenting for medication refills.  He has history of dyslipidemia and he denies a history of HTN and diabetes. Reports a family history of stroke in his father at age 33 and this was fatal. He denies a family history of heart disease.   He does not exercises about 50 minutes per week.  He is willing to try and increase this.   He would like a flu shot today.   He has a history of having a difficult time waking up from anesthesia for a colonoscopy.  Since that time he has been using the cologaurd.     He has No Known Allergies.   He  has a past medical history of Allergy; Arthritis; and Hyperlipidemia.    He  reports that he quit smoking about 27 years ago. He has a 2.50 pack-year smoking history. He has never used smokeless tobacco. He reports that he does not drink alcohol or use drugs. He  reports that he currently engages in sexual activity. The patient  has a past surgical history that includes Knee surgery; Hip surgery; and Shoulder surgery.  His family history includes Diabetes in his mother and sister; Stroke in his father.  Review of Systems  Constitutional: Negative for chills and fever.  Respiratory: Negative for cough and shortness of breath.   Cardiovascular: Negative for chest pain and leg swelling.  Skin: Negative for rash.    The problem list and medications were reviewed and updated by myself where necessary and exist elsewhere in the encounter.   OBJECTIVE:  BP 126/88 (BP Location: Right Arm, Patient Position: Sitting, Cuff Size: Large)   Pulse 87   Temp 98.3 F (36.8 C) (Oral)   Resp 18   Ht 6\' 1"  (1.854 m)   Wt (!) 372 lb (168.7 kg)   SpO2 95%   BMI 49.08 kg/m   Physical Exam  Cardiovascular: Normal rate and regular rhythm.   Pulmonary/Chest: Effort normal and breath sounds normal.  Musculoskeletal: Normal range of motion.    Lab Results    Component Value Date   CREATININE 1.00 08/30/2013   Lab Results  Component Value Date   CHOL 191 08/30/2013   HDL 40 08/30/2013   LDLCALC 125 (H) 08/30/2013   TRIG 129 08/30/2013   CHOLHDL 4.8 08/30/2013   Lab Results  Component Value Date   ALT 41 08/30/2013   AST 19 03/02/2013   ALKPHOS 93 03/02/2013   BILITOT 0.9 03/02/2013   Lab Results  Component Value Date   WBC 5.4 03/02/2013   HGB 15.2 03/02/2013   HCT 44.1 03/02/2013   MCV 83.8 03/02/2013   PLT 232 03/02/2013   Lab Results  Component Value Date   NA 139 08/30/2013   K 4.4 08/30/2013   CL 103 08/30/2013   CO2 29 08/30/2013   Lab Results  Component Value Date   TSH 0.02 (L) 09/26/2013     No results found for this or any previous visit (from the past 72 hour(s)).  No results found.  ASSESSMENT AND PLAN  Kayvon was seen today for follow-up.  Diagnoses and all orders for this visit:  Dyslipidemia: He wants an annual physical and will come back for that in a week or two.  I will refill his medication for now.   -     simvastatin (ZOCOR) 80 MG tablet; Take 1 tablet (80 mg  total) by mouth daily.  Needs flu shot -     Flu Vaccine QUAD 36+ mos IM    The patient is advised to call or return to clinic if he does not see an improvement in symptoms, or to seek the care of the closest emergency department if he worsens with the above plan.   Philis Fendt, MHS, PA-C [facility] 01/03/2016 8:23 AM

## 2016-01-03 NOTE — Patient Instructions (Signed)
     IF you received an x-ray today, you will receive an invoice from [facility]. Please contact [facility] at [phone] with questions or concerns regarding your invoice.   IF you received labwork today, you will receive an invoice from [facility]. Please contact [facility] at [phone] with questions or concerns regarding your invoice.   Our billing staff will not be able to assist you with questions regarding bills from these companies.  You will be contacted with the lab results as soon as they are available. The fastest way to get your results is to activate your My Chart account. Instructions are located on the last page of this paperwork. If you have not heard from us regarding the results in 2 weeks, please contact this office.     

## 2016-01-08 ENCOUNTER — Ambulatory Visit (INDEPENDENT_AMBULATORY_CARE_PROVIDER_SITE_OTHER): Payer: Medicare HMO | Admitting: Physician Assistant

## 2016-01-08 VITALS — BP 138/90 | HR 80 | Temp 98.4°F | Resp 16 | Ht 73.0 in | Wt 371.0 lb

## 2016-01-08 DIAGNOSIS — I10 Essential (primary) hypertension: Secondary | ICD-10-CM | POA: Diagnosis not present

## 2016-01-08 DIAGNOSIS — Z1211 Encounter for screening for malignant neoplasm of colon: Secondary | ICD-10-CM | POA: Diagnosis not present

## 2016-01-08 DIAGNOSIS — F439 Reaction to severe stress, unspecified: Secondary | ICD-10-CM

## 2016-01-08 DIAGNOSIS — E785 Hyperlipidemia, unspecified: Secondary | ICD-10-CM

## 2016-01-08 DIAGNOSIS — R946 Abnormal results of thyroid function studies: Secondary | ICD-10-CM

## 2016-01-08 DIAGNOSIS — R69 Illness, unspecified: Secondary | ICD-10-CM | POA: Diagnosis not present

## 2016-01-08 DIAGNOSIS — R7989 Other specified abnormal findings of blood chemistry: Secondary | ICD-10-CM

## 2016-01-08 NOTE — Progress Notes (Signed)
01/08/2016 9:47 AM   DOB: April 08, 1955 / MRN: [id number]  SUBJECTIVE:  Nathan Fleming is a 60 y.o. male presenting for labs and colon screening.  Reports a difficult time with anesthesia for his last colonoscopy and says "I had a difficult time waking up from the anesthesia."  Since that time he has been using the fecal screenings.  He feels well today and has no complaints. He would like routine labs drawn today.   Reports his wife is leaving him at the new year.  This is new to him and very stressful. He is not sure why she is leaving, and tells him that he did not fail her as a husband, but that she needs to leave for herself.   Immunization History  Administered Date(s) Administered  . Influenza,inj,Quad PF,36+ Mos 03/02/2013, 01/03/2016   He has No Known Allergies.   He  has a past medical history of Allergy; Arthritis; and Hyperlipidemia.    He  reports that he quit smoking about 27 years ago. He has a 2.50 pack-year smoking history. He has never used smokeless tobacco. He reports that he does not drink alcohol or use drugs. He  reports that he currently engages in sexual activity. The patient  has a past surgical history that includes Knee surgery; Hip surgery; and Shoulder surgery.  His family history includes Diabetes in his mother and sister; Stroke in his father.  Review of Systems  Constitutional: Negative for chills and fever.  Respiratory: Negative for cough.   Cardiovascular: Negative for chest pain and leg swelling.  Skin: Negative for itching and rash.  Neurological: Negative for dizziness.    The problem list and medications were reviewed and updated by myself where necessary and exist elsewhere in the encounter.   OBJECTIVE:  BP 138/90 (BP Location: Right Arm, Patient Position: Sitting, Cuff Size: Large)   Pulse 80   Temp 98.4 F (36.9 C) (Oral)   Resp 16   Ht _0  (1.854 m)   Wt (!) 371 lb (168.3 kg)   SpO2 93%   BMI 48.95 kg/m   Physical Exam    Constitutional: He is oriented to person, place, and time. He appears well-developed.  Cardiovascular: Normal rate and regular rhythm.   Pulmonary/Chest: Effort normal and breath sounds normal.  Musculoskeletal: Normal range of motion.  Neurological: He is alert and oriented to person, place, and time.  Skin: Skin is warm and dry. He is not diaphoretic.  Psychiatric: He has a normal mood and affect.    Lab Results  Component Value Date   WBC 5.4 03/02/2013   HGB 15.2 03/02/2013   HCT 44.1 03/02/2013   MCV 83.8 03/02/2013   PLT 232 03/02/2013   Lab Results  Component Value Date   CHOL 191 08/30/2013   HDL 40 08/30/2013   LDLCALC 125 (H) 08/30/2013   TRIG 129 08/30/2013   CHOLHDL 4.8 08/30/2013   Lab Results  Component Value Date   TSH 0.02 (L) 09/26/2013   Lab Results  Component Value Date   NA 139 08/30/2013   K 4.4 08/30/2013   CL 103 08/30/2013   CO2 29 08/30/2013   Lab Results  Component Value Date   ALT 41 08/30/2013   AST 19 03/02/2013   ALKPHOS 93 03/02/2013   BILITOT 0.9 03/02/2013   Lab Results  Component Value Date   CREATININE 1.00 08/30/2013        ASSESSMENT AND PLAN  Clayburn was seen today for labwork.  Diagnoses and all orders for this visit:  Essential hypertension Comments: Controlled.  Continue current plan.  Orders: -     CMP14+EGFR  Decreased thyroid stimulating hormone (TSH) level -     TSH  Dyslipidemia -     Lipid panel  Obesity, morbid (HCC) -     Hemoglobin A1c  Special screening for malignant neoplasms, colon -     Cologuard  Stress at home Comments: Advised that he continue his exericse program and to contact me anytime he needs help.  Counseling discussed as an option. Follow up in one month.      The patient is advised to call or return to clinic if he does not see an improvement in symptoms, or to seek the care of the closest emergency department if he worsens with the above plan.   Philis Fendt, MHS,  PA-C [facility] 01/08/2016 9:47 AM

## 2016-01-08 NOTE — Patient Instructions (Signed)
     IF you received an x-ray today, you will receive an invoice from [facility]. Please contact [facility] at [phone] with questions or concerns regarding your invoice.   IF you received labwork today, you will receive an invoice from [facility]. Please contact [facility] at [phone] with questions or concerns regarding your invoice.   Our billing staff will not be able to assist you with questions regarding bills from these companies.  You will be contacted with the lab results as soon as they are available. The fastest way to get your results is to activate your My Chart account. Instructions are located on the last page of this paperwork. If you have not heard from us regarding the results in 2 weeks, please contact this office.     

## 2016-01-09 LAB — HEMOGLOBIN A1C
Est. average glucose Bld gHb Est-mCnc: 123 mg/dL
Hgb A1c MFr Bld: 5.9 % — ABNORMAL HIGH (ref 4.8–5.6)

## 2016-01-09 LAB — CMP14+EGFR
ALBUMIN: 3.6 g/dL (ref 3.6–4.8)
ALT: 50 IU/L — ABNORMAL HIGH (ref 0–44)
AST: 24 IU/L (ref 0–40)
Albumin/Globulin Ratio: 1 — ABNORMAL LOW (ref 1.2–2.2)
Alkaline Phosphatase: 124 IU/L — ABNORMAL HIGH (ref 39–117)
BILIRUBIN TOTAL: 0.9 mg/dL (ref 0.0–1.2)
BUN / CREAT RATIO: 10 (ref 10–24)
BUN: 10 mg/dL (ref 8–27)
CHLORIDE: 104 mmol/L (ref 96–106)
CO2: 25 mmol/L (ref 18–29)
Calcium: 9 mg/dL (ref 8.6–10.2)
Creatinine, Ser: 1 mg/dL (ref 0.76–1.27)
GFR calc non Af Amer: 81 mL/min/{1.73_m2} (ref >59)
GFR, EST AFRICAN AMERICAN: 93 mL/min/{1.73_m2} (ref >59)
Globulin, Total: 3.6 g/dL (ref 1.5–4.5)
Glucose: 94 mg/dL (ref 65–99)
POTASSIUM: 5 mmol/L (ref 3.5–5.2)
Sodium: 143 mmol/L (ref 134–144)
Total Protein: 7.2 g/dL (ref 6.0–8.5)

## 2016-01-09 LAB — LIPID PANEL
CHOLESTEROL TOTAL: 181 mg/dL (ref 100–199)
Chol/HDL Ratio: 4.4 ratio units (ref 0.0–5.0)
HDL: 41 mg/dL (ref >39)
LDL Calculated: 117 mg/dL — ABNORMAL HIGH (ref 0–99)
Triglycerides: 113 mg/dL (ref 0–149)
VLDL Cholesterol Cal: 23 mg/dL (ref 5–40)

## 2016-01-09 LAB — TSH: TSH: 0.021 u[IU]/mL — ABNORMAL LOW (ref 0.450–4.500)

## 2016-01-09 NOTE — Progress Notes (Signed)
Please add on free t4 and free t3. Have him come back if needed.

## 2016-01-13 DIAGNOSIS — R946 Abnormal results of thyroid function studies: Secondary | ICD-10-CM | POA: Diagnosis not present

## 2016-01-13 NOTE — Addendum Note (Signed)
Addended by: Gari Crown D on: 01/13/2016 02:39 PM   Modules accepted: Orders

## 2016-01-14 LAB — T4, FREE: FREE T4: 1.3 ng/dL (ref 0.82–1.77)

## 2016-01-14 LAB — T3, FREE: T3, Free: 3.2 pg/mL (ref 2.0–4.4)

## 2016-01-15 NOTE — Progress Notes (Signed)
TSH very low however thyroid hormone stable.  Will monitor.  No further action required at this time. Philis Fendt, MS, PA-C 8:21 AM, 01/15/2016

## 2016-02-02 ENCOUNTER — Ambulatory Visit: Payer: Medicare HMO | Admitting: Physician Assistant

## 2016-03-11 ENCOUNTER — Ambulatory Visit (INDEPENDENT_AMBULATORY_CARE_PROVIDER_SITE_OTHER): Payer: Medicare HMO | Admitting: Physician Assistant

## 2016-03-11 VITALS — BP 132/90 | HR 80 | Temp 98.1°F | Resp 18 | Ht 73.0 in | Wt 360.2 lb

## 2016-03-11 DIAGNOSIS — G933 Postviral fatigue syndrome: Secondary | ICD-10-CM

## 2016-03-11 DIAGNOSIS — G9331 Postviral fatigue syndrome: Secondary | ICD-10-CM

## 2016-03-11 DIAGNOSIS — R6883 Chills (without fever): Secondary | ICD-10-CM

## 2016-03-11 DIAGNOSIS — R05 Cough: Secondary | ICD-10-CM

## 2016-03-11 DIAGNOSIS — R059 Cough, unspecified: Secondary | ICD-10-CM

## 2016-03-11 MED ORDER — AZITHROMYCIN 250 MG PO TABS: ORAL_TABLET | ORAL | 0 refills | Status: AC

## 2016-03-11 NOTE — Progress Notes (Signed)
03/11/2016 8:36 AM   DOB: 1955-06-22 / MRN: [id number]  SUBJECTIVE:  Nathan Fleming is a 61 y.o. male presenting for influenza like illness thjat has come and gone for the last two week.  He did have the flu shot.  Says he had terrible cough, fever,  Sneezing, chills, myalgia, and denies HA.  He does not have a history of asthma however does have an inhaler for brohnchitis, however has not needed this for this illness. Has tried Nyquil, Tylenol, pushing fluids. Normal bowel movements, - dysuria, abdominal pain, chest pain, leg swelling, posterior leg pain.   Immunization History  Administered Date(s) Administered  . Influenza,inj,Quad PF,36+ Mos 03/02/2013, 01/03/2016   He has No Known Allergies.   He  has a past medical history of Allergy; Arthritis; and Hyperlipidemia.    He  reports that he quit smoking about 28 years ago. He has a 2.50 pack-year smoking history. He has never used smokeless tobacco. He reports that he does not drink alcohol or use drugs. He  reports that he currently engages in sexual activity. The patient  has a past surgical history that includes Knee surgery; Hip surgery; and Shoulder surgery.  His family history includes Diabetes in his mother and sister; Stroke in his father.  Review of Systems  Constitutional: Negative for fever.  HENT: Negative for sore throat.   Respiratory: Negative for cough and shortness of breath.   Cardiovascular: Negative for chest pain, palpitations and leg swelling.    The problem list and medications were reviewed and updated by myself where necessary and exist elsewhere in the encounter.   OBJECTIVE:  BP 132/90   Pulse 80   Temp 98.1 F (36.7 C) (Oral)   Resp 18   Ht 6\' 1"  (1.854 m)   Wt (!) 360 lb 3.2 oz (163.4 kg)   SpO2 95%   BMI 47.52 kg/m    Wt Readings from Last 3 Encounters:  03/11/16 (!) 360 lb 3.2 oz (163.4 kg)  01/08/16 (!) 371 lb (168.3 kg)  01/03/16 (!) 372 lb (168.7 kg)   Pulse Readings from Last 3  Encounters:  03/11/16 80  01/08/16 80  01/03/16 87   BP Readings from Last 3 Encounters:  03/11/16 132/90  01/08/16 138/90  01/03/16 126/88   Lab Results  Component Value Date   CREATININE 1.00 01/08/2016   Physical Exam  Constitutional: He is oriented to person, place, and time.  Cardiovascular: Normal rate, regular rhythm and normal heart sounds.  Exam reveals no gallop, no friction rub and no decreased pulses.   No murmur heard. Pulmonary/Chest: Effort normal and breath sounds normal. No respiratory distress. He has no decreased breath sounds. He has no wheezes. He has no rhonchi. He has no rales.  Musculoskeletal: Normal range of motion. He exhibits no edema, tenderness or deformity.  Neurological: He is alert and oriented to person, place, and time.  Skin: Skin is warm and dry. He is not diaphoretic.  Psychiatric: He has a normal mood and affect.    No results found for this or any previous visit (from the past 72 hour(s)).  No results found.  ASSESSMENT AND PLAN:  Nathan Fleming was seen today for cough, nasal congestion, influenza and chills.  Diagnoses and all orders for this visit:  Cough: While his exam is normal today his HPI concerns me for an atypical pneumonia. I will cover for that etiology. I did advise that this may also be post viral inflammation as well.  At any rate,  he is not wheezing and denies SOB, chest pain, and any new DOE.  Will see him back as needed for this.  Will plan to see him in my clinic in about 1-2 months to complete his health maintenance.  -     azithromycin (ZITHROMAX) 250 MG tablet; Take 2 tabs PO x 1 dose, then 1 tab PO QD x 4 days  Post-influenza syndrome  Chills (without fever) -     azithromycin (ZITHROMAX) 250 MG tablet; Take 2 tabs PO x 1 dose, then 1 tab PO QD x 4 days    The patient is advised to call or return to clinic if he does not see an improvement in symptoms, or to seek the care of the closest emergency department if he  worsens with the above plan.   Philis Fendt, MHS, PA-C [facility] 03/11/2016 8:36 AM

## 2016-03-11 NOTE — Patient Instructions (Signed)
     IF you received an x-ray today, you will receive an invoice from [facility]. Please contact [facility] at [phone] with questions or concerns regarding your invoice.   IF you received labwork today, you will receive an invoice from [facility]. Please contact [facility] at [phone] with questions or concerns regarding your invoice.   Our billing staff will not be able to assist you with questions regarding bills from these companies.  You will be contacted with the lab results as soon as they are available. The fastest way to get your results is to activate your My Chart account. Instructions are located on the last page of this paperwork. If you have not heard from us regarding the results in 2 weeks, please contact this office.     

## 2016-05-10 ENCOUNTER — Ambulatory Visit: Payer: Medicare HMO | Admitting: Physician Assistant

## 2016-09-10 ENCOUNTER — Ambulatory Visit (INDEPENDENT_AMBULATORY_CARE_PROVIDER_SITE_OTHER): Payer: Medicare HMO | Admitting: Physician Assistant

## 2016-09-10 ENCOUNTER — Ambulatory Visit (INDEPENDENT_AMBULATORY_CARE_PROVIDER_SITE_OTHER): Payer: Medicare HMO

## 2016-09-10 ENCOUNTER — Encounter: Payer: Self-pay | Admitting: Physician Assistant

## 2016-09-10 VITALS — BP 158/92 | HR 76 | Temp 97.8°F | Resp 18 | Ht 73.0 in | Wt 364.8 lb

## 2016-09-10 DIAGNOSIS — R7989 Other specified abnormal findings of blood chemistry: Secondary | ICD-10-CM

## 2016-09-10 DIAGNOSIS — I10 Essential (primary) hypertension: Secondary | ICD-10-CM

## 2016-09-10 DIAGNOSIS — R7309 Other abnormal glucose: Secondary | ICD-10-CM

## 2016-09-10 DIAGNOSIS — Z23 Encounter for immunization: Secondary | ICD-10-CM | POA: Diagnosis not present

## 2016-09-10 DIAGNOSIS — R946 Abnormal results of thyroid function studies: Secondary | ICD-10-CM

## 2016-09-10 DIAGNOSIS — R030 Elevated blood-pressure reading, without diagnosis of hypertension: Secondary | ICD-10-CM

## 2016-09-10 DIAGNOSIS — M545 Low back pain, unspecified: Secondary | ICD-10-CM

## 2016-09-10 DIAGNOSIS — E785 Hyperlipidemia, unspecified: Secondary | ICD-10-CM | POA: Diagnosis not present

## 2016-09-10 DIAGNOSIS — R918 Other nonspecific abnormal finding of lung field: Secondary | ICD-10-CM | POA: Diagnosis not present

## 2016-09-10 MED ORDER — TRAMADOL HCL 50 MG PO TABS: 25.0000 mg | ORAL_TABLET | Freq: Three times a day (TID) | ORAL | 0 refills | Status: DC | PRN

## 2016-09-10 MED ORDER — FUROSEMIDE 40 MG PO TABS: 40.0000 mg | ORAL_TABLET | Freq: Every day | ORAL | 11 refills | Status: DC

## 2016-09-10 MED ORDER — SIMVASTATIN 80 MG PO TABS: 80.0000 mg | ORAL_TABLET | Freq: Every day | ORAL | 11 refills | Status: DC

## 2016-09-10 NOTE — Patient Instructions (Addendum)
Please try to walk on your treadmill for 150 minutes per week (20 minutes per day 7 days a week OR 30 mintues 5 days at week). Think of this as medication.  Check out In "Defense of Food" by Merlinda Frederick     IF you received an x-ray today, you will receive an invoice from [facility]. Please contact [facility] at [phone] with questions or concerns regarding your invoice.   IF you received labwork today, you will receive an invoice from [place]. Please contact [facility] at [phone] with questions or concerns regarding your invoice.   Our billing staff will not be able to assist you with questions regarding bills from these companies.  You will be contacted with the lab results as soon as they are available. The fastest way to get your results is to activate your My Chart account. Instructions are located on the last page of this paperwork. If you have not heard from [place] regarding the results in 2 weeks, please contact this office.    ;

## 2016-09-10 NOTE — Progress Notes (Signed)
09/10/2016 2:29 PM   DOB: 12-19-1955 / MRN: [id number]  SUBJECTIVE:  Nathan Fleming is a 60 y.o. male presenting for labs and medication refills. He feels well today physically. Did have a slip down last week while trying to sit in a roller chair but feels that he is getting better. Has a history of hypertension and dyslipidemia. Would like refills of lasix and Zocor.  Is willing to come back in one month for BP med titration.  His wife did move out however he still spends a lot of time with her and seems to be at piece with the relationship.    Immunization History  Administered Date(s) Administered  . Influenza,inj,Quad PF,6+ Mos 03/02/2013, 01/03/2016, 09/10/2016  . Tdap 09/10/2016     He has No Known Allergies.   He  has a past medical history of Allergy; Arthritis; and Hyperlipidemia.    He  reports that he quit smoking about 28 years ago. He has a 2.50 pack-year smoking history. He has never used smokeless tobacco. He reports that he does not drink alcohol or use drugs. He  reports that he currently engages in sexual activity. The patient  has a past surgical history that includes Knee surgery; Hip surgery; and Shoulder surgery.  His family history includes Diabetes in his mother and sister; Stroke in his father.  Review of Systems  Constitutional: Negative for chills, diaphoresis and fever.  Respiratory: Negative for cough, hemoptysis, sputum production, shortness of breath and wheezing.   Cardiovascular: Negative for chest pain, orthopnea and leg swelling.  Gastrointestinal: Negative for abdominal pain, blood in stool, constipation, diarrhea, heartburn, melena, nausea and vomiting.  Genitourinary: Negative for flank pain.  Skin: Negative for rash.  Neurological: Negative for dizziness.    The problem list and medications were reviewed and updated by myself where necessary and exist elsewhere in the encounter.   OBJECTIVE:  BP (!) 158/92   Pulse 76   Temp 97.8 F (36.6  C) (Oral)   Resp 18   Ht 6\' 1"  (1.854 m)   Wt (!) 364 lb 12.8 oz (165.5 kg)   SpO2 94%   BMI 48.13 kg/m   Wt Readings from Last 3 Encounters:  09/10/16 (!) 364 lb 12.8 oz (165.5 kg)  03/11/16 (!) 360 lb 3.2 oz (163.4 kg)  01/08/16 (!) 371 lb (168.3 kg)   BP Readings from Last 3 Encounters:  09/10/16 (!) 158/92  03/11/16 132/90  01/08/16 138/90    Physical Exam  Constitutional: He is oriented to person, place, and time. He appears well-developed. He is active and cooperative.  Non-toxic appearance.  Eyes: Pupils are equal, round, and reactive to light. EOM are normal.  Cardiovascular: Normal rate, regular rhythm, S1 normal, S2 normal, normal heart sounds, intact distal pulses and normal pulses.  Exam reveals no gallop and no friction rub.   No murmur heard. Pulmonary/Chest: Effort normal. No stridor. No tachypnea. No respiratory distress. He has no wheezes. He has no rales. He exhibits no tenderness.  Abdominal: He exhibits no distension.  Musculoskeletal: He exhibits no edema or tenderness.  Neurological: He is alert and oriented to person, place, and time. He has normal strength and normal reflexes. He is not disoriented. No cranial nerve deficit or sensory deficit. He exhibits normal muscle tone. Coordination and gait normal.  Skin: Skin is warm and dry. He is not diaphoretic. No pallor.  Psychiatric: His behavior is normal.  Vitals reviewed.  Lab Results  Component Value Date  WBC 5.4 03/02/2013   HGB 15.2 03/02/2013   HCT 44.1 03/02/2013   MCV 83.8 03/02/2013   PLT 232 03/02/2013   Lab Results  Component Value Date   NA 143 01/08/2016   K 5.0 01/08/2016   CL 104 01/08/2016   CO2 25 01/08/2016   Lab Results  Component Value Date   CREATININE 1.00 01/08/2016   Lab Results  Component Value Date   ALT 50 (H) 01/08/2016   AST 24 01/08/2016   ALKPHOS 124 (H) 01/08/2016   BILITOT 0.9 01/08/2016   Lab Results  Component Value Date   TSH 0.021 (L) 01/08/2016      Lab Results  Component Value Date   HGBA1C 5.9 (H) 01/08/2016    Lab Results  Component Value Date   CHOL 181 01/08/2016   HDL 41 01/08/2016   LDLCALC 117 (H) 01/08/2016   TRIG 113 01/08/2016   CHOLHDL 4.4 01/08/2016   No results found for this or any previous visit (from the past 72 hour(s)).  Dg Chest 2 View  Result Date: 09/10/2016 CLINICAL DATA:  Patient with hypertension. EXAM: CHEST  2 VIEW COMPARISON:  None. FINDINGS: Cardiomegaly. Tortuosity of the thoracic aorta. Low lung volumes. Bibasilar heterogeneous pulmonary opacities. No pleural effusion pneumothorax. Thoracic spine degenerative changes. IMPRESSION: Low lung volumes with basilar heterogeneous opacities which may represent atelectasis or infection. Cardiomegaly. Electronically Signed   By: Lovey Newcomer M.D.   On: 09/10/2016 08:44    ASSESSMENT AND PLAN:  Nathan Fleming was seen today for medication refill and immunizations.  Diagnoses and all orders for this visit:  Essential hypertension: His lungs are clear. He has been off of his lasix.  Starting that back today and will see him back.  I have tried to find a more cost effective option for his simvastatin however this appears to be the best option for the benefit. I did advise that he start an exercise program per AVS.  -     CBC -     CMP and Liver -     DG Chest 2 View; Future -     EKG 12-Lead       -     furosemide (LASIX) 40 MG tablet; Take 1 tablet (40 mg total) by mouth daily.  Dyslipidemia -     Lipid panel -     simvastatin (ZOCOR) 80 MG tablet; Take 1 tablet (80 mg total) by mouth daily.  Low TSH level -     TSH  Elevated hemoglobin A1c measurement -     Hemoglobin A1c  Needs flu shot -     Flu Vaccine QUAD 36+ mos IM  Need for Tdap vaccination -     Tdap vaccine greater than or equal to 7yo IM  Elevated blood pressure reading -     Recheck vitals  Acute right-sided low back pain without sciatica: Due to a fall.  I don't want him on NSAIDs  given his BP today.         -     traMADol (ULTRAM) 50 MG tablet; Take 0.5-1 tablets (25-50 mg total) by mouth every 8 (eight) hours        as needed.         The patient is advised to call or return to clinic if he does not see an improvement in symptoms, or to seek the care of the closest emergency department if he worsens with the above plan.   Philis Fendt, MHS, PA-C  [facility] [place] 09/10/2016 2:29 PM

## 2016-09-11 LAB — HEMOGLOBIN A1C
Est. average glucose Bld gHb Est-mCnc: 120 mg/dL
Hgb A1c MFr Bld: 5.8 % — ABNORMAL HIGH (ref 4.8–5.6)

## 2016-09-11 LAB — CMP AND LIVER
ALBUMIN: 3.9 g/dL (ref 3.6–4.8)
ALT: 25 IU/L (ref 0–44)
AST: 26 IU/L (ref 0–40)
Alkaline Phosphatase: 102 IU/L (ref 39–117)
BILIRUBIN TOTAL: 1 mg/dL (ref 0.0–1.2)
BILIRUBIN, DIRECT: 0.22 mg/dL (ref 0.00–0.40)
BUN: 12 mg/dL (ref 8–27)
CALCIUM: 8.5 mg/dL — AB (ref 8.6–10.2)
CHLORIDE: 100 mmol/L (ref 96–106)
CO2: 24 mmol/L (ref 20–29)
Creatinine, Ser: 0.86 mg/dL (ref 0.76–1.27)
GFR calc non Af Amer: 94 mL/min/{1.73_m2} (ref >59)
GFR, EST AFRICAN AMERICAN: 108 mL/min/{1.73_m2} (ref >59)
Glucose: 85 mg/dL (ref 65–99)
POTASSIUM: 5 mmol/L (ref 3.5–5.2)
SODIUM: 140 mmol/L (ref 134–144)
Total Protein: 7.3 g/dL (ref 6.0–8.5)

## 2016-09-11 LAB — LIPID PANEL
CHOL/HDL RATIO: 4.6 ratio (ref 0.0–5.0)
Cholesterol, Total: 198 mg/dL (ref 100–199)
HDL: 43 mg/dL (ref >39)
LDL CALC: 130 mg/dL — AB (ref 0–99)
Triglycerides: 125 mg/dL (ref 0–149)
VLDL Cholesterol Cal: 25 mg/dL (ref 5–40)

## 2016-09-11 LAB — CBC
HEMATOCRIT: 47.1 % (ref 37.5–51.0)
HEMOGLOBIN: 15.9 g/dL (ref 13.0–17.7)
MCH: 29 pg (ref 26.6–33.0)
MCHC: 33.8 g/dL (ref 31.5–35.7)
MCV: 86 fL (ref 79–97)
Platelets: 183 10*3/uL (ref 150–379)
RBC: 5.48 x10E6/uL (ref 4.14–5.80)
RDW: 13.6 % (ref 12.3–15.4)
WBC: 5.2 10*3/uL (ref 3.4–10.8)

## 2016-09-11 LAB — TSH: TSH: 0.176 u[IU]/mL — ABNORMAL LOW (ref 0.450–4.500)

## 2016-09-16 LAB — T4, FREE: Free T4: 1.19 ng/dL (ref 0.82–1.77)

## 2016-09-16 LAB — SPECIMEN STATUS REPORT

## 2016-10-11 ENCOUNTER — Ambulatory Visit: Payer: Medicare HMO | Admitting: Physician Assistant

## 2017-01-03 ENCOUNTER — Other Ambulatory Visit: Payer: Self-pay

## 2017-01-03 ENCOUNTER — Encounter: Payer: Self-pay | Admitting: Physician Assistant

## 2017-01-03 ENCOUNTER — Ambulatory Visit (INDEPENDENT_AMBULATORY_CARE_PROVIDER_SITE_OTHER): Payer: Medicare HMO | Admitting: Physician Assistant

## 2017-01-03 VITALS — BP 160/90 | HR 80 | Temp 97.8°F | Resp 18 | Ht 74.0 in | Wt 369.4 lb

## 2017-01-03 DIAGNOSIS — R7989 Other specified abnormal findings of blood chemistry: Secondary | ICD-10-CM

## 2017-01-03 DIAGNOSIS — I10 Essential (primary) hypertension: Secondary | ICD-10-CM

## 2017-01-03 DIAGNOSIS — E039 Hypothyroidism, unspecified: Secondary | ICD-10-CM

## 2017-01-03 DIAGNOSIS — M79672 Pain in left foot: Secondary | ICD-10-CM

## 2017-01-03 DIAGNOSIS — B353 Tinea pedis: Secondary | ICD-10-CM

## 2017-01-03 LAB — POCT SKIN KOH: Skin KOH, POC: POSITIVE — AB

## 2017-01-03 MED ORDER — CHLORTHALIDONE 25 MG PO TABS: 12.5000 mg | ORAL_TABLET | Freq: Every day | ORAL | 0 refills | Status: DC

## 2017-01-03 MED ORDER — TERBINAFINE HCL 250 MG PO TABS: 250.0000 mg | ORAL_TABLET | Freq: Every day | ORAL | 0 refills | Status: DC

## 2017-01-03 NOTE — Progress Notes (Signed)
01/04/2017 3:52 PM   DOB: 03/14/1955 / MRN: [id number]  SUBJECTIVE:  Nathan Fleming is a 61 y.o. male presenting for foot pain.  Started after walking on hard wood floors two days ago.  Feels that he is improving.   Immunization History  Administered Date(s) Administered  . Influenza,inj,Quad PF,6+ Mos 03/02/2013, 01/03/2016, 09/10/2016  . Tdap 09/10/2016     He has No Known Allergies.   He  has a past medical history of Allergy, Arthritis, and Hyperlipidemia.    He  reports that he quit smoking about 28 years ago. He has a 2.50 pack-year smoking history. he has never used smokeless tobacco. He reports that he does not drink alcohol or use drugs. He  reports that he currently engages in sexual activity. The patient  has a past surgical history that includes Knee surgery; Hip surgery; and Shoulder surgery.  His family history includes Diabetes in his mother and sister; Stroke in his father.  Review of Systems  Constitutional: Negative for chills, diaphoresis and fever.  Eyes: Negative.   Respiratory: Negative for cough, hemoptysis, sputum production, shortness of breath and wheezing.   Cardiovascular: Negative for chest pain, orthopnea and leg swelling.  Gastrointestinal: Negative for nausea.  Skin: Negative for rash.  Neurological: Negative for dizziness, sensory change, speech change, focal weakness and headaches.    The problem list and medications were reviewed and updated by myself where necessary and exist elsewhere in the encounter.   OBJECTIVE:  BP (!) 160/90   Pulse 80   Temp 97.8 F (36.6 C) (Oral)   Resp 18   Ht 6\' 2"  (1.88 m)   Wt (!) 369 lb 6.4 oz (167.6 kg)   SpO2 94%   BMI 47.43 kg/m   Physical Exam  Constitutional: He appears well-developed. He is active and cooperative.  Non-toxic appearance.  Cardiovascular: Normal rate.  Pulmonary/Chest: Effort normal. No tachypnea.  Neurological: He is alert.  Skin: Skin is warm and dry. He is not diaphoretic.  No pallor.     Vitals reviewed.   Lab Results  Component Value Date   WBC 5.2 09/10/2016   HGB 15.9 09/10/2016   HCT 47.1 09/10/2016   MCV 86 09/10/2016   PLT 183 09/10/2016    Lab Results  Component Value Date   CREATININE 0.86 01/03/2017   BUN 11 01/03/2017   NA 143 01/03/2017   K 4.5 01/03/2017   CL 100 01/03/2017   CO2 29 01/03/2017    Lab Results  Component Value Date   ALT 32 01/03/2017   AST 23 01/03/2017   ALKPHOS 111 01/03/2017   BILITOT 0.8 01/03/2017    Lab Results  Component Value Date   TSH 0.157 (L) 01/03/2017    Lab Results  Component Value Date   HGBA1C 5.8 (H) 09/10/2016    Lab Results  Component Value Date   CHOL 198 09/10/2016   HDL 43 09/10/2016   LDLCALC 130 (H) 09/10/2016   TRIG 125 09/10/2016   CHOLHDL 4.6 09/10/2016   Wt Readings from Last 3 Encounters:  01/03/17 (!) 369 lb 6.4 oz (167.6 kg)  09/10/16 (!) 364 lb 12.8 oz (165.5 kg)  03/11/16 (!) 360 lb 3.2 oz (163.4 kg)   BP Readings from Last 3 Encounters:  01/03/17 (!) 160/90  09/10/16 (!) 158/92  03/11/16 132/90     Results for orders placed or performed in visit on 01/03/17 (from the past 72 hour(s))  CMP and Liver     Status:  None   Collection Time: 01/03/17  5:49 PM  Result Value Ref Range   Glucose 97 65 - 99 mg/dL   BUN 11 8 - 27 mg/dL   Creatinine, Ser 0.86 0.76 - 1.27 mg/dL   GFR calc non Af Amer 93 >59 mL/min/1.73   GFR calc Af Amer 107 >59 mL/min/1.73   Sodium 143 134 - 144 mmol/L   Potassium 4.5 3.5 - 5.2 mmol/L   Chloride 100 96 - 106 mmol/L   CO2 29 20 - 29 mmol/L   Calcium 8.9 8.6 - 10.2 mg/dL   Total Protein 7.3 6.0 - 8.5 g/dL   Albumin 3.9 3.6 - 4.8 g/dL   Bilirubin Total 0.8 0.0 - 1.2 mg/dL   Bilirubin, Direct 0.22 0.00 - 0.40 mg/dL   Alkaline Phosphatase 111 39 - 117 IU/L   AST 23 0 - 40 IU/L   ALT 32 0 - 44 IU/L  TSH     Status: Abnormal   Collection Time: 01/03/17  5:49 PM  Result Value Ref Range   TSH 0.157 (L) 0.450 - 4.500 uIU/mL    Uric Acid     Status: None   Collection Time: 01/03/17  5:49 PM  Result Value Ref Range   Uric Acid 6.0 3.7 - 8.6 mg/dL    Comment:            Therapeutic target for gout patients: <6.0  POCT Skin KOH     Status: Abnormal   Collection Time: 01/03/17  5:49 PM  Result Value Ref Range   Skin KOH, POC Positive (A) Negative    No results found.  ASSESSMENT AND PLAN:  [place] was seen today for foot pain.  Diagnoses and all orders for this visit:  Pain of left heel -     Uric Acid  Essential hypertension: Stopping lasix.  Starting longer acting diuretic.  Will see him back in about 1 month.  -     CMP and Liver -     TSH -     chlorthalidone (HYGROTON) 25 MG tablet; Take 0.5 tablets (12.5 mg total) by mouth daily.  Athlete's foot on left: He has onychomycosis about the bilateral toes.  I worry with the combination of his edema and fungal infection that he could develop a cellulitis.  He can barely put his shoes on 2/2 body habitus and thus I do not expect he will be able to manually care for his feet. I advised that he get a pedicure.  Will start terbinafine.   -     POCT Skin KOH -     terbinafine (LAMISIL) 250 MG tablet; Take 1 tablet (250 mg total) by mouth daily.    The patient is advised to call or return to clinic if he does not see an improvement in symptoms, or to seek the care of the closest emergency department if he worsens with the above plan.   Philis Fendt, MHS, PA-C [facility] [place] 01/04/2017 3:52 PM

## 2017-01-04 LAB — CMP AND LIVER
ALK PHOS: 111 IU/L (ref 39–117)
ALT: 32 IU/L (ref 0–44)
AST: 23 IU/L (ref 0–40)
Albumin: 3.9 g/dL (ref 3.6–4.8)
BILIRUBIN TOTAL: 0.8 mg/dL (ref 0.0–1.2)
BILIRUBIN, DIRECT: 0.22 mg/dL (ref 0.00–0.40)
BUN: 11 mg/dL (ref 8–27)
CO2: 29 mmol/L (ref 20–29)
CREATININE: 0.86 mg/dL (ref 0.76–1.27)
Calcium: 8.9 mg/dL (ref 8.6–10.2)
Chloride: 100 mmol/L (ref 96–106)
GFR calc Af Amer: 107 mL/min/{1.73_m2} (ref >59)
GFR, EST NON AFRICAN AMERICAN: 93 mL/min/{1.73_m2} (ref >59)
Glucose: 97 mg/dL (ref 65–99)
POTASSIUM: 4.5 mmol/L (ref 3.5–5.2)
Sodium: 143 mmol/L (ref 134–144)
Total Protein: 7.3 g/dL (ref 6.0–8.5)

## 2017-01-04 LAB — TSH: TSH: 0.157 u[IU]/mL — AB (ref 0.450–4.500)

## 2017-01-04 LAB — URIC ACID: Uric Acid: 6 mg/dL (ref 3.7–8.6)

## 2017-01-07 NOTE — Progress Notes (Signed)
Please add on a free t4.

## 2017-01-10 DIAGNOSIS — E039 Hypothyroidism, unspecified: Secondary | ICD-10-CM | POA: Diagnosis not present

## 2017-01-10 NOTE — Addendum Note (Signed)
Addended by: Gari Crown D on: 01/10/2017 09:42 AM   Modules accepted: Orders

## 2017-01-11 LAB — T4, FREE: Free T4: 1.18 ng/dL (ref 0.82–1.77)

## 2017-06-05 ENCOUNTER — Other Ambulatory Visit: Payer: Self-pay | Admitting: Physician Assistant

## 2017-06-05 DIAGNOSIS — I10 Essential (primary) hypertension: Secondary | ICD-10-CM

## 2017-08-22 ENCOUNTER — Telehealth: Payer: Self-pay | Admitting: Physician Assistant

## 2017-08-22 NOTE — Telephone Encounter (Signed)
Copied from [place] [id number]. Topic: Quick Communication - See Telephone Encounter >> Aug 22, 2017  8:39 AM Loma Boston wrote: CRM for notification. See Telephone encounter for: 08/22/17. Needs Meds ASAP Has made appt. 8/7  runs out on Thursday Please get enough thru Wednesday till PCP gets back chlorthalidone (HYGROTON) 25 MG tablet   [facility]

## 2017-08-23 ENCOUNTER — Telehealth: Payer: Self-pay | Admitting: Physician Assistant

## 2017-08-23 ENCOUNTER — Other Ambulatory Visit: Payer: Self-pay | Admitting: Physician Assistant

## 2017-08-23 DIAGNOSIS — I10 Essential (primary) hypertension: Secondary | ICD-10-CM

## 2017-08-23 NOTE — Telephone Encounter (Unsigned)
Copied from [place] [id number]. Topic: Quick Communication - See Telephone Encounter >> Aug 22, 2017  8:39 AM Loma Boston wrote: CRM for notification. See Telephone encounter for: 08/22/17. Needs Meds ASAP Has made appt. 8/7  runs out on Thursday Please get enough thru Wednesday till PCP gets back chlorthalidone (HYGROTON) 25 MG tablet   [facility]  [phone]

## 2017-08-24 NOTE — Telephone Encounter (Signed)
Medication approved yesterday by Philis Fendt PA

## 2017-08-24 NOTE — Telephone Encounter (Signed)
Duplicate request medication has been approved

## 2017-08-31 ENCOUNTER — Ambulatory Visit: Payer: Medicare HMO | Admitting: Physician Assistant

## 2017-09-01 ENCOUNTER — Ambulatory Visit (INDEPENDENT_AMBULATORY_CARE_PROVIDER_SITE_OTHER): Payer: Medicare HMO | Admitting: Physician Assistant

## 2017-09-01 ENCOUNTER — Encounter: Payer: Self-pay | Admitting: Physician Assistant

## 2017-09-01 ENCOUNTER — Other Ambulatory Visit: Payer: Self-pay

## 2017-09-01 VITALS — BP 124/87 | HR 79 | Temp 98.0°F | Resp 16 | Ht 74.0 in | Wt 348.0 lb

## 2017-09-01 DIAGNOSIS — E785 Hyperlipidemia, unspecified: Secondary | ICD-10-CM | POA: Diagnosis not present

## 2017-09-01 DIAGNOSIS — J45998 Other asthma: Secondary | ICD-10-CM

## 2017-09-01 DIAGNOSIS — I10 Essential (primary) hypertension: Secondary | ICD-10-CM | POA: Diagnosis not present

## 2017-09-01 DIAGNOSIS — B351 Tinea unguium: Secondary | ICD-10-CM | POA: Diagnosis not present

## 2017-09-01 DIAGNOSIS — R7303 Prediabetes: Secondary | ICD-10-CM | POA: Diagnosis not present

## 2017-09-01 MED ORDER — SIMVASTATIN 80 MG PO TABS: 80.0000 mg | ORAL_TABLET | Freq: Every day | ORAL | 11 refills | Status: DC

## 2017-09-01 MED ORDER — EFINACONAZOLE 10 % EX SOLN: 1.0000 "application " | Freq: Every day | CUTANEOUS | 11 refills | Status: DC

## 2017-09-01 MED ORDER — ROSUVASTATIN CALCIUM 5 MG PO TABS: 5.0000 mg | ORAL_TABLET | Freq: Every day | ORAL | 3 refills | Status: DC

## 2017-09-01 MED ORDER — CHLORTHALIDONE 25 MG PO TABS: 12.5000 mg | ORAL_TABLET | Freq: Every day | ORAL | 3 refills | Status: DC

## 2017-09-01 MED ORDER — ALBUTEROL SULFATE HFA 108 (90 BASE) MCG/ACT IN AERS: 2.0000 | INHALATION_SPRAY | Freq: Four times a day (QID) | RESPIRATORY_TRACT | 11 refills | Status: DC | PRN

## 2017-09-01 NOTE — Patient Instructions (Addendum)
If you stay here please see Dr. Mitchel Honour.  He is excellent.  If you decide to come to [facility] I will be there after September 3rd.     I will contact you with your lab results within the next 2 weeks.  If you have not heard from [place] then please contact us. The fastest way to get your results is to register for My Chart.   IF you received an x-ray today, you will receive an invoice from [facility]. Please contact [facility] at [phone] with questions or concerns regarding your invoice.   IF you received labwork today, you will receive an invoice from [facility]. Please contact [facility] at [phone] with questions or concerns regarding your invoice.   Our billing staff will not be able to assist you with questions regarding bills from these companies.  You will be contacted with the lab results as soon as they are available. The fastest way to get your results is to activate your My Chart account. Instructions are located on the last page of this paperwork. If you have not heard from [place] regarding the results in 2 weeks, please contact this office.

## 2017-09-01 NOTE — Progress Notes (Signed)
09/01/2017 9:17 AM   DOB: September 29, 1955 / MRN: [id number]  SUBJECTIVE:  Nathan Fleming is a 62 year old African-American male here today for refills of chronic medications.  Has a history of well-controlled hypertension taking chlorthalidone 12.5 mg daily and does not miss doses.  He has a history of dyslipidemia managed with Zocor 80.  He is willing to try Crestor 5 mg as long as cost is nearly equivalent.  He is asked that I sent both to the pharmacy so he can make a decision on which he would like to fill.  He has a history of onychomycosis and tried to been fine however this caused nausea and stomach cramping and so he stopped the medication.  He would like to know his other options for this today.  He has a history of asthma and tells me that at worst he needs his inhaler once every 3 to 4 days during extreme hot temperatures or extreme cold temperatures.  Most weeks of the year he does not require any medication.  He has a distant history of smoking he has no history of COPD.  He has No Known Allergies.   He  has a past medical history of Allergy, Arthritis, and Hyperlipidemia.    He  reports that he quit smoking about 29 years ago. He has a 2.50 pack-year smoking history. He has never used smokeless tobacco. He reports that he does not drink alcohol or use drugs. He  reports that he currently engages in sexual activity. The patient  has a past surgical history that includes Knee surgery; Hip surgery; and Shoulder surgery.  His family history includes Diabetes in his mother and sister; Stroke in his father.  Review of Systems  Constitutional: Negative for chills, diaphoresis and fever.  Eyes: Negative.   Respiratory: Negative for cough, hemoptysis, sputum production, shortness of breath and wheezing.   Cardiovascular: Negative for chest pain, orthopnea and leg swelling.  Gastrointestinal: Negative for abdominal pain, blood in stool, constipation, diarrhea, heartburn, melena, nausea and  vomiting.  Genitourinary: Negative for dysuria, flank pain, frequency, hematuria and urgency.  Skin: Negative for rash.  Neurological: Negative for dizziness, sensory change, speech change, focal weakness and headaches.    The problem list and medications were reviewed and updated by myself where necessary and exist elsewhere in the encounter.   OBJECTIVE:  BP 124/87   Pulse 79   Temp 98 F (36.7 C) (Oral)   Resp 16   Ht 6\' 2"  (1.88 m)   Wt (!) 348 lb (157.9 kg)   SpO2 95%   BMI 44.68 kg/m   Wt Readings from Last 3 Encounters:  09/01/17 (!) 348 lb (157.9 kg)  01/03/17 (!) 369 lb 6.4 oz (167.6 kg)  09/10/16 (!) 364 lb 12.8 oz (165.5 kg)   Temp Readings from Last 3 Encounters:  09/01/17 98 F (36.7 C) (Oral)  01/03/17 97.8 F (36.6 C) (Oral)  09/10/16 97.8 F (36.6 C) (Oral)   BP Readings from Last 3 Encounters:  09/01/17 124/87  01/03/17 (!) 160/90  09/10/16 (!) 158/92   Pulse Readings from Last 3 Encounters:  09/01/17 79  01/03/17 80  09/10/16 76    Physical Exam  Constitutional: He is oriented to person, place, and time. He appears well-developed. He is active.  Non-toxic appearance. He does not appear ill.  Eyes: Pupils are equal, round, and reactive to light. Conjunctivae and EOM are normal.  Cardiovascular: Normal rate, regular rhythm, S1 normal, S2 normal, normal heart sounds,  intact distal pulses and normal pulses. Exam reveals no gallop and no friction rub.  No murmur heard. Pulmonary/Chest: Effort normal. No stridor. No respiratory distress. He has no wheezes. He has no rales.  Abdominal: He exhibits no distension.  Musculoskeletal: Normal range of motion. He exhibits no edema.  Neurological: He is alert and oriented to person, place, and time. He has normal strength and normal reflexes. He is not disoriented. No cranial nerve deficit or sensory deficit. He exhibits normal muscle tone. Coordination and gait normal.  Skin: Skin is warm and dry. He is not  diaphoretic. No pallor.  Psychiatric: He has a normal mood and affect. His behavior is normal.  Nursing note and vitals reviewed.   Lab Results  Component Value Date   HGBA1C 5.8 (H) 09/10/2016    Lab Results  Component Value Date   WBC 5.2 09/10/2016   HGB 15.9 09/10/2016   HCT 47.1 09/10/2016   MCV 86 09/10/2016   PLT 183 09/10/2016    Lab Results  Component Value Date   CREATININE 0.86 01/03/2017   BUN 11 01/03/2017   NA 143 01/03/2017   K 4.5 01/03/2017   CL 100 01/03/2017   CO2 29 01/03/2017    Lab Results  Component Value Date   ALT 32 01/03/2017   AST 23 01/03/2017   ALKPHOS 111 01/03/2017   BILITOT 0.8 01/03/2017    Lab Results  Component Value Date   TSH 0.157 (L) 01/03/2017    Lab Results  Component Value Date   CHOL 198 09/10/2016   HDL 43 09/10/2016   LDLCALC 130 (H) 09/10/2016   TRIG 125 09/10/2016   CHOLHDL 4.6 09/10/2016     ASSESSMENT AND PLAN:  Nathan Fleming was seen today for medication refill.  Diagnoses and all orders for this visit:  Seasonal asthma -     albuterol (PROVENTIL HFA;VENTOLIN HFA) 108 (90 Base) MCG/ACT inhaler; Inhale 2 puffs into the lungs every 6 (six) hours as needed for wheezing or shortness of breath.  Essential hypertension -     chlorthalidone (HYGROTON) 25 MG tablet; Take 0.5 tablets (12.5 mg total) by mouth daily. -     Renal Function Panel -     Hemoglobin A1c  Dyslipidemia: Patient will fill one or the other. -     simvastatin (ZOCOR) 80 MG tablet; Take 1 tablet (80 mg total) by mouth daily at 6 PM. -     rosuvastatin (CRESTOR) 5 MG tablet; Take 1 tablet (5 mg total) by mouth daily. Patient to fill this or the simvastatin. -     Hepatic function panel  Onychomycosis -     Efinaconazole 10 % SOLN; Apply 1 application topically daily.    The patient is advised to call or return to clinic if he does not see an improvement in symptoms, or to seek the care of the closest emergency department if he worsens  with the above plan.   Philis Fendt, MHS, PA-C [facility] 09/01/2017 9:17 AM

## 2017-09-02 LAB — RENAL FUNCTION PANEL
Albumin: 3.9 g/dL (ref 3.6–4.8)
BUN / CREAT RATIO: 9 — AB (ref 10–24)
BUN: 9 mg/dL (ref 8–27)
CO2: 24 mmol/L (ref 20–29)
Calcium: 9 mg/dL (ref 8.6–10.2)
Chloride: 102 mmol/L (ref 96–106)
Creatinine, Ser: 1.01 mg/dL (ref 0.76–1.27)
GFR, EST AFRICAN AMERICAN: 92 mL/min/{1.73_m2} (ref >59)
GFR, EST NON AFRICAN AMERICAN: 79 mL/min/{1.73_m2} (ref >59)
GLUCOSE: 91 mg/dL (ref 65–99)
POTASSIUM: 4 mmol/L (ref 3.5–5.2)
Phosphorus: 2.9 mg/dL (ref 2.5–4.5)
Sodium: 143 mmol/L (ref 134–144)

## 2017-09-02 LAB — HEPATIC FUNCTION PANEL
ALK PHOS: 88 IU/L (ref 39–117)
ALT: 14 IU/L (ref 0–44)
AST: 14 IU/L (ref 0–40)
Bilirubin Total: 0.6 mg/dL (ref 0.0–1.2)
Bilirubin, Direct: 0.18 mg/dL (ref 0.00–0.40)
Total Protein: 7.4 g/dL (ref 6.0–8.5)

## 2017-09-02 LAB — HEMOGLOBIN A1C
ESTIMATED AVERAGE GLUCOSE: 117 mg/dL
HEMOGLOBIN A1C: 5.7 % — AB (ref 4.8–5.6)

## 2017-09-26 ENCOUNTER — Encounter: Payer: Self-pay | Admitting: Family Medicine

## 2017-09-26 DIAGNOSIS — R7303 Prediabetes: Secondary | ICD-10-CM | POA: Insufficient documentation

## 2017-12-29 DIAGNOSIS — R69 Illness, unspecified: Secondary | ICD-10-CM | POA: Diagnosis not present

## 2018-03-01 ENCOUNTER — Ambulatory Visit: Payer: Medicare HMO | Admitting: Emergency Medicine

## 2018-03-27 ENCOUNTER — Ambulatory Visit: Payer: Medicare HMO | Admitting: Family Medicine

## 2018-04-24 ENCOUNTER — Other Ambulatory Visit: Payer: Self-pay

## 2018-04-24 DIAGNOSIS — I10 Essential (primary) hypertension: Secondary | ICD-10-CM

## 2018-04-24 DIAGNOSIS — R7989 Other specified abnormal findings of blood chemistry: Secondary | ICD-10-CM

## 2018-04-24 DIAGNOSIS — R7303 Prediabetes: Secondary | ICD-10-CM

## 2018-04-25 ENCOUNTER — Encounter: Payer: Self-pay | Admitting: Emergency Medicine

## 2018-04-25 ENCOUNTER — Telehealth (INDEPENDENT_AMBULATORY_CARE_PROVIDER_SITE_OTHER): Payer: Medicare HMO | Admitting: Emergency Medicine

## 2018-04-25 DIAGNOSIS — I10 Essential (primary) hypertension: Secondary | ICD-10-CM

## 2018-04-25 DIAGNOSIS — J302 Other seasonal allergic rhinitis: Secondary | ICD-10-CM | POA: Diagnosis not present

## 2018-04-25 MED ORDER — CHLORTHALIDONE 25 MG PO TABS: 12.5000 mg | ORAL_TABLET | Freq: Every day | ORAL | 3 refills | Status: DC

## 2018-04-25 MED ORDER — CETIRIZINE HCL 10 MG PO TABS: 10.0000 mg | ORAL_TABLET | Freq: Every day | ORAL | 11 refills | Status: DC

## 2018-04-25 MED ORDER — TRIAMCINOLONE ACETONIDE 55 MCG/ACT NA AERO: 2.0000 | INHALATION_SPRAY | Freq: Every day | NASAL | 12 refills | Status: DC

## 2018-04-25 NOTE — Progress Notes (Signed)
Telemedicine Encounter- SOAP NOTE Established Patient  This telephone encounter was conducted with the patient's (or proxy's) verbal consent via audio telecommunications: yes/no: Yes Patient was instructed to have this encounter in a suitably private space; and to only have persons present to whom they give permission to participate. In addition, patient identity was confirmed by use of name plus two identifiers (DOB and address).  I discussed the limitations, risks, security and privacy concerns of performing an evaluation and management service by telephone and the availability of in person appointments. I also discussed with the patient that there may be a patient responsible charge related to this service. The patient expressed understanding and agreed to proceed.  I spent a total of TIME; 0 MIN TO 60 MIN: 20 minutes talking with the patient or their proxy.  No chief complaint on file. Follow-up of blood pressure and seasonal allergies  Subjective   Nathan Fleming is a 63 y.o. male established patient.  Used to see PA Carlis Abbott.  First visit with me.  Telephone visit today for follow-up of hypertension and seasonal allergies.  Needs medication refills.  Also has occasional aches and pains to his legs.  Status post left knee and right hip replacements.  Blood pressure at home 120/80. No other complaints or medical concerns today.  HPI   Patient Active Problem List   Diagnosis Date Noted  . Prediabetes 09/26/2017  . Hyperthyroidism 09/26/2013  . Other testicular hypofunction 09/26/2013  . Erectile dysfunction 08/30/2013  . Edema of both legs 03/04/2013  . Dyslipidemia 03/04/2013  . Obesity, morbid ([place]) 03/04/2013    Past Medical History:  Diagnosis Date  . Allergy   . Arthritis   . Hyperlipidemia     Current Outpatient Medications  Medication Sig Dispense Refill  . albuterol (PROVENTIL HFA;VENTOLIN HFA) 108 (90 Base) MCG/ACT inhaler Inhale 2 puffs into the lungs every 6  (six) hours as needed for wheezing or shortness of breath. 1 Inhaler 11  . Blood Pressure Monitoring (BLOOD PRESSURE MONITOR/WRIST) DEVI 1 Device by Does not apply route daily. 1 Device 0  . chlorthalidone (HYGROTON) 25 MG tablet Take 0.5 tablets (12.5 mg total) by mouth daily. 45 tablet 3  . Efinaconazole 10 % SOLN Apply 1 application topically daily. 8 mL 11  . Menthol, Topical Analgesic, (BIOFREEZE EX) Apply 1 application topically daily as needed (knee pain).    . rosuvastatin (CRESTOR) 5 MG tablet Take 1 tablet (5 mg total) by mouth daily. Patient to fill this or the simvastatin. 90 tablet 3  . simvastatin (ZOCOR) 80 MG tablet Take 1 tablet (80 mg total) by mouth daily at 6 PM. 30 tablet 11  . cetirizine (ZYRTEC) 10 MG tablet Take 1 tablet (10 mg total) by mouth daily for 7 days. 10 tablet 11  . triamcinolone (NASACORT) 55 MCG/ACT AERO nasal inhaler Place 2 sprays into the nose daily. 1 Inhaler 12   No current facility-administered medications for this visit.     No Known Allergies  Social History   Socioeconomic History  . Marital status: Married    Spouse name: Not on file  . Number of children: Not on file  . Years of education: Not on file  . Highest education level: Not on file  Occupational History  . Occupation: retired  Scientific laboratory technician  . Financial resource strain: Not on file  . Food insecurity:    Worry: Not on file    Inability: Not on file  . Transportation needs:  Medical: Not on file    Non-medical: Not on file  Tobacco Use  . Smoking status: Former Smoker    Packs/day: 0.25    Years: 10.00    Pack years: 2.50    Last attempt to quit: 01/19/1988    Years since quitting: 30.2  . Smokeless tobacco: Never Used  Substance and Sexual Activity  . Alcohol use: No  . Drug use: No  . Sexual activity: Yes  Lifestyle  . Physical activity:    Days per week: Not on file    Minutes per session: Not on file  . Stress: Not on file  Relationships  . Social  connections:    Talks on phone: Not on file    Gets together: Not on file    Attends religious service: Not on file    Active member of club or organization: Not on file    Attends meetings of clubs or organizations: Not on file    Relationship status: Not on file  . Intimate partner violence:    Fear of current or ex partner: Not on file    Emotionally abused: Not on file    Physically abused: Not on file    Forced sexual activity: Not on file  Other Topics Concern  . Not on file  Social History Narrative   Married;   Moved from [place] in 2014   Retired   1 son    Review of Systems  Constitutional: Negative.  Negative for chills and fever.  HENT: Negative.  Negative for congestion and sore throat.   Eyes: Negative.   Respiratory: Negative.  Negative for cough and shortness of breath.   Cardiovascular: Negative.   Gastrointestinal: Negative.  Negative for abdominal pain, diarrhea, nausea and vomiting.  Genitourinary: Negative.  Negative for dysuria and hematuria.  Musculoskeletal: Positive for joint pain.  Skin: Negative.  Negative for rash.  Neurological: Negative for dizziness and headaches.  Endo/Heme/Allergies: Positive for environmental allergies.  All other systems reviewed and are negative.   Objective   Vitals as reported by the patient: Blood pressure 120/80, self-reported. There were no vitals filed for this visit. Awake and alert, oriented x3, in no apparent respiratory distress when talking. Diagnoses and all orders for this visit:  Seasonal allergies -     cetirizine (ZYRTEC) 10 MG tablet; Take 1 tablet (10 mg total) by mouth daily for 7 days. -     triamcinolone (NASACORT) 55 MCG/ACT AERO nasal inhaler; Place 2 sprays into the nose daily.  Essential hypertension -     chlorthalidone (HYGROTON) 25 MG tablet; Take 0.5 tablets (12.5 mg total) by mouth daily.  No medical concerns identified today during this visit. Continue present medications. Follow-up in 3  to 6 months.   I discussed the assessment and treatment plan with the patient. The patient was provided an opportunity to ask questions and all were answered. The patient agreed with the plan and demonstrated an understanding of the instructions.   The patient was advised to call back or seek an in-person evaluation if the symptoms worsen or if the condition fails to improve as anticipated.  I provided 20 minutes of non-face-to-face time during this encounter.  Horald Pollen, MD  Primary Care at [facility]

## 2018-04-25 NOTE — Progress Notes (Signed)
Pt c/o BP and needs refills on meds also having some allergy problems would like some medication for that. Lastly having some pain in both legs due to weather. No travel.

## 2018-07-11 ENCOUNTER — Telehealth: Payer: Self-pay | Admitting: Emergency Medicine

## 2018-07-11 NOTE — Telephone Encounter (Signed)
What medications?  Patient has appointment to see me in 2 days.

## 2018-07-11 NOTE — Telephone Encounter (Signed)
Relation to pt: self  Call back [phone] Pharmacy: [id number] - [place], [place] [phone] (Phone) [phone] (Fax)     Reason for call:  Patient requesting chlorthalidone (HYGROTON) 25 MG tablet , simvastatin (ZOCOR) 80 MG tablet , albuterol (PROVENTIL HFA;VENTOLIN HFA) 108 (90 Base) MCG/ACT inhaler, informed patient please allow 48 hour turn around time.

## 2018-07-13 ENCOUNTER — Telehealth (INDEPENDENT_AMBULATORY_CARE_PROVIDER_SITE_OTHER): Payer: Medicare HMO | Admitting: Emergency Medicine

## 2018-07-13 ENCOUNTER — Encounter: Payer: Self-pay | Admitting: Emergency Medicine

## 2018-07-13 ENCOUNTER — Other Ambulatory Visit: Payer: Self-pay

## 2018-07-13 VITALS — BP 120/80 | Temp 97.7°F | Ht 73.0 in | Wt 341.0 lb

## 2018-07-13 DIAGNOSIS — J45998 Other asthma: Secondary | ICD-10-CM | POA: Diagnosis not present

## 2018-07-13 DIAGNOSIS — I10 Essential (primary) hypertension: Secondary | ICD-10-CM

## 2018-07-13 DIAGNOSIS — E785 Hyperlipidemia, unspecified: Secondary | ICD-10-CM

## 2018-07-13 DIAGNOSIS — R062 Wheezing: Secondary | ICD-10-CM

## 2018-07-13 DIAGNOSIS — N529 Male erectile dysfunction, unspecified: Secondary | ICD-10-CM

## 2018-07-13 MED ORDER — CHLORTHALIDONE 25 MG PO TABS: 12.5000 mg | ORAL_TABLET | Freq: Every day | ORAL | 3 refills | Status: DC

## 2018-07-13 MED ORDER — SILDENAFIL CITRATE 100 MG PO TABS: 50.0000 mg | ORAL_TABLET | Freq: Every day | ORAL | 11 refills | Status: DC | PRN

## 2018-07-13 MED ORDER — PREDNISONE 20 MG PO TABS: 20.0000 mg | ORAL_TABLET | Freq: Every day | ORAL | 0 refills | Status: AC

## 2018-07-13 MED ORDER — ALBUTEROL SULFATE HFA 108 (90 BASE) MCG/ACT IN AERS: 2.0000 | INHALATION_SPRAY | Freq: Four times a day (QID) | RESPIRATORY_TRACT | 5 refills | Status: DC | PRN

## 2018-07-13 MED ORDER — SIMVASTATIN 80 MG PO TABS: 80.0000 mg | ORAL_TABLET | Freq: Every day | ORAL | 3 refills | Status: DC

## 2018-07-13 NOTE — Progress Notes (Signed)
Telemedicine Encounter- SOAP NOTE Established Patient  This audio-telephone encounter was conducted with the patient's (or proxy's) verbal consent via audio telecommunications: yes/no: Yes Patient was instructed to have this encounter in a suitably private space; and to only have persons present to whom they give permission to participate. In addition, patient identity was confirmed by use of name plus two identifiers (DOB and address).  I discussed the limitations, risks, security and privacy concerns of performing an evaluation and management service by telephone and the availability of in person appointments. I also discussed with the patient that there may be a patient responsible charge related to this service. The patient expressed understanding and agreed to proceed.  I spent a total of TIME; 0 MIN TO 60 MIN: 15 minutes talking with the patient or their proxy.  No chief complaint on file. Asthma and wheezing  Subjective   Nathan Fleming is a 63 y.o. male established patient. Telephone visit today complaining of asthma attack last night and this morning with wheezing.  Used albuterol inhaler with success.  Needs refill.  Has a history of asthma for 7 years seasonal allergies.  No history of COPD.  Non-smoker.  Denies flulike symptoms.  Denies fever or chills.  Denies productive cough.  Has a history of hypertension on chlorthalidone, needs medication refill.  Blood pressure today was 120/80. Also complaining of erectile dysfunction. No other complaints or medical concerns.  HPI   Patient Active Problem List   Diagnosis Date Noted  . Essential hypertension 04/25/2018  . Seasonal allergies 04/25/2018  . Prediabetes 09/26/2017  . Hyperthyroidism 09/26/2013  . Other testicular hypofunction 09/26/2013  . Erectile dysfunction 08/30/2013  . Dyslipidemia 03/04/2013  . Obesity, morbid ([place]) 03/04/2013    Past Medical History:  Diagnosis Date  . Allergy   . Arthritis   .  Hyperlipidemia     Current Outpatient Medications  Medication Sig Dispense Refill  . albuterol (VENTOLIN HFA) 108 (90 Base) MCG/ACT inhaler Inhale 2 puffs into the lungs every 6 (six) hours as needed for wheezing or shortness of breath. 6.7 g 5  . chlorthalidone (HYGROTON) 25 MG tablet Take 0.5 tablets (12.5 mg total) by mouth daily. 45 tablet 3  . Menthol, Topical Analgesic, (BIOFREEZE EX) Apply 1 application topically daily as needed (knee pain).    . triamcinolone (NASACORT) 55 MCG/ACT AERO nasal inhaler Place 2 sprays into the nose daily. 1 Inhaler 12  . Blood Pressure Monitoring (BLOOD PRESSURE MONITOR/WRIST) DEVI 1 Device by Does not apply route daily. 1 Device 0  . cetirizine (ZYRTEC) 10 MG tablet Take 1 tablet (10 mg total) by mouth daily for 7 days. 10 tablet 11  . Efinaconazole 10 % SOLN Apply 1 application topically daily. (Patient not taking: Reported on 07/13/2018) 8 mL 11  . predniSONE (DELTASONE) 20 MG tablet Take 1 tablet (20 mg total) by mouth daily with breakfast for 5 days. 5 tablet 0  . rosuvastatin (CRESTOR) 5 MG tablet Take 1 tablet (5 mg total) by mouth daily. Patient to fill this or the simvastatin. (Patient not taking: Reported on 07/13/2018) 90 tablet 3  . sildenafil (VIAGRA) 100 MG tablet Take 0.5-1 tablets (50-100 mg total) by mouth daily as needed for erectile dysfunction. 7 tablet 11  . simvastatin (ZOCOR) 80 MG tablet Take 1 tablet (80 mg total) by mouth daily at 6 PM. 90 tablet 3   No current facility-administered medications for this visit.     No Known Allergies  Social History  Socioeconomic History  . Marital status: Married    Spouse name: Not on file  . Number of children: Not on file  . Years of education: Not on file  . Highest education level: Not on file  Occupational History  . Occupation: retired  Scientific laboratory technician  . Financial resource strain: Not on file  . Food insecurity    Worry: Not on file    Inability: Not on file  . Transportation  needs    Medical: Not on file    Non-medical: Not on file  Tobacco Use  . Smoking status: Former Smoker    Packs/day: 0.25    Years: 10.00    Pack years: 2.50    Quit date: 01/19/1988    Years since quitting: 30.5  . Smokeless tobacco: Never Used  Substance and Sexual Activity  . Alcohol use: No  . Drug use: No  . Sexual activity: Yes  Lifestyle  . Physical activity    Days per week: Not on file    Minutes per session: Not on file  . Stress: Not on file  Relationships  . Social Herbalist on phone: Not on file    Gets together: Not on file    Attends religious service: Not on file    Active member of club or organization: Not on file    Attends meetings of clubs or organizations: Not on file    Relationship status: Not on file  . Intimate partner violence    Fear of current or ex partner: Not on file    Emotionally abused: Not on file    Physically abused: Not on file    Forced sexual activity: Not on file  Other Topics Concern  . Not on file  Social History Narrative   Married;   Moved from [place] in 2014   Retired   1 son    Review of Systems  Constitutional: Negative.  Negative for chills and fever.  HENT: Negative.  Negative for congestion and sore throat.   Eyes: Negative.   Respiratory: Positive for shortness of breath and wheezing. Negative for cough.   Cardiovascular: Negative.  Negative for chest pain and palpitations.  Gastrointestinal: Negative for abdominal pain, diarrhea, nausea and vomiting.  Genitourinary:       Erectile dysfunction  Musculoskeletal: Positive for back pain. Negative for myalgias and neck pain.  Skin: Negative.  Negative for rash.  Neurological: Negative for dizziness and headaches.  All other systems reviewed and are negative.   Objective   Vitals as reported by the patient: Today's Vitals   07/13/18 1107  BP: 120/80  Temp: 97.7 F (36.5 C)  TempSrc: Oral  Weight: (!) 341 lb (154.7 kg)  Height: 6\' 1"  (1.854 m)   Physical Exam Constitutional:      Appearance: Normal appearance.  HENT:     Head: Normocephalic.  Eyes:     Extraocular Movements: Extraocular movements intact.  Neck:     Musculoskeletal: Normal range of motion.  Pulmonary:     Effort: Pulmonary effort is normal.  Neurological:     Mental Status: He is oriented to person, place, and time.  Psychiatric:        Mood and Affect: Mood normal.        Behavior: Behavior normal.     Diagnoses and all orders for this visit:  Seasonal asthma -     albuterol (VENTOLIN HFA) 108 (90 Base) MCG/ACT inhaler; Inhale 2 puffs into the lungs  every 6 (six) hours as needed for wheezing or shortness of breath. -     predniSONE (DELTASONE) 20 MG tablet; Take 1 tablet (20 mg total) by mouth daily with breakfast for 5 days.  Essential hypertension -     chlorthalidone (HYGROTON) 25 MG tablet; Take 0.5 tablets (12.5 mg total) by mouth daily.  Dyslipidemia -     simvastatin (ZOCOR) 80 MG tablet; Take 1 tablet (80 mg total) by mouth daily at 6 PM.  Erectile dysfunction, unspecified erectile dysfunction type -     sildenafil (VIAGRA) 100 MG tablet; Take 0.5-1 tablets (50-100 mg total) by mouth daily as needed for erectile dysfunction.  Wheezing     I discussed the assessment and treatment plan with the patient. The patient was provided an opportunity to ask questions and all were answered. The patient agreed with the plan and demonstrated an understanding of the instructions.   The patient was advised to call back or seek an in-person evaluation if the symptoms worsen or if the condition fails to improve as anticipated.  I provided 15 minutes of non-face-to-face time during this encounter.  Horald Pollen, MD  Primary Care at [facility]

## 2018-07-13 NOTE — Progress Notes (Signed)
Called patient to triage for appointment. Patient states he has allergies and a little asthma. Patient states he used the pump, the albuterol inhaler this morning and it helped a lot. Patient needs medication refills for albuterol inhaler, simvastatin and chlorthalidone. Patient states no other issues to discuss.

## 2018-10-14 DIAGNOSIS — R69 Illness, unspecified: Secondary | ICD-10-CM | POA: Diagnosis not present

## 2018-12-11 ENCOUNTER — Telehealth: Payer: Self-pay | Admitting: Emergency Medicine

## 2018-12-11 NOTE — Telephone Encounter (Signed)
Please advise if patient needs appt or when letter is ready for pick up  Copied from [place] [id number]. Topic: General - Other >> Dec 11, 2018  2:19 PM Celene Kras wrote: Reason for CRM: Pt called stating he is needing a letter stating he is not able to sit through jury due to his leg and back pain. Pt states he also has a hard time keeping his mask on due to bronchitis.  Please advise.

## 2018-12-12 ENCOUNTER — Encounter: Payer: Self-pay | Admitting: Emergency Medicine

## 2018-12-12 NOTE — Telephone Encounter (Signed)
Yes, it's OK. Thanks.

## 2018-12-12 NOTE — Telephone Encounter (Signed)
Pls advise Is it ok to write a note not being able to sit for jury duty due to leg

## 2019-01-04 ENCOUNTER — Other Ambulatory Visit: Payer: Self-pay

## 2019-01-04 ENCOUNTER — Telehealth (INDEPENDENT_AMBULATORY_CARE_PROVIDER_SITE_OTHER): Payer: Medicare HMO | Admitting: Emergency Medicine

## 2019-01-04 ENCOUNTER — Telehealth: Payer: Self-pay | Admitting: Emergency Medicine

## 2019-01-04 ENCOUNTER — Encounter: Payer: Self-pay | Admitting: Emergency Medicine

## 2019-01-04 VITALS — BP 120/80 | Temp 97.8°F | Ht 74.0 in | Wt 370.0 lb

## 2019-01-04 DIAGNOSIS — R350 Frequency of micturition: Secondary | ICD-10-CM | POA: Diagnosis not present

## 2019-01-04 DIAGNOSIS — R300 Dysuria: Secondary | ICD-10-CM | POA: Diagnosis not present

## 2019-01-04 MED ORDER — CIPROFLOXACIN HCL 500 MG PO TABS: 500.0000 mg | ORAL_TABLET | Freq: Two times a day (BID) | ORAL | 0 refills | Status: AC

## 2019-01-04 NOTE — Progress Notes (Signed)
Telemedicine Encounter- SOAP NOTE Established Patient  This telephone encounter was conducted with the patient's (or proxy's) verbal consent via audio telecommunications: yes/no: Yes Patient was instructed to have this encounter in a suitably private space; and to only have persons present to whom they give permission to participate. In addition, patient identity was confirmed by use of name plus two identifiers (DOB and address).  I discussed the limitations, risks, security and privacy concerns of performing an evaluation and management service by telephone and the availability of in person appointments. I also discussed with the patient that there may be a patient responsible charge related to this service. The patient expressed understanding and agreed to proceed.  I spent a total of TIME; 0 MIN TO 60 MIN: 15 minutes talking with the patient or their proxy.  Chief Complaint  Patient presents with  . Urinary Frequency    started x 2-3 days with burning and odor  . Cough    started x 1 week and chills for a couple of nights    Subjective   Nathan Fleming is a 63 y.o. male established patient. Telephone visit today complaining of burning on urination with frequency and bad smell to the urine that started about 5 days ago.  Had mild cough 1 week ago but better now.  Last night had temperature of 99.6 and this morning it was 97.8.  Denies abdominal or flank pain.  Able to eat and drink.  Denies nausea or vomiting.  Denies diarrhea.  Denies loss of smell or taste.  Denies any other flulike symptoms.  Has history of hypertension and prediabetes. No other complaints or medical concerns today.  HPI   Patient Active Problem List   Diagnosis Date Noted  . Essential hypertension 04/25/2018  . Seasonal allergies 04/25/2018  . Prediabetes 09/26/2017  . Hyperthyroidism 09/26/2013  . Other testicular hypofunction 09/26/2013  . Erectile dysfunction 08/30/2013  . Dyslipidemia 03/04/2013  .  Obesity, morbid ([place]) 03/04/2013    Past Medical History:  Diagnosis Date  . Allergy   . Arthritis   . Hyperlipidemia     Current Outpatient Medications  Medication Sig Dispense Refill  . albuterol (VENTOLIN HFA) 108 (90 Base) MCG/ACT inhaler Inhale 2 puffs into the lungs every 6 (six) hours as needed for wheezing or shortness of breath. 6.7 g 5  . chlorthalidone (HYGROTON) 25 MG tablet Take 0.5 tablets (12.5 mg total) by mouth daily. 45 tablet 3  . rosuvastatin (CRESTOR) 5 MG tablet Take 1 tablet (5 mg total) by mouth daily. Patient to fill this or the simvastatin. 90 tablet 3  . sildenafil (VIAGRA) 100 MG tablet Take 0.5-1 tablets (50-100 mg total) by mouth daily as needed for erectile dysfunction. 7 tablet 11  . Blood Pressure Monitoring (BLOOD PRESSURE MONITOR/WRIST) DEVI 1 Device by Does not apply route daily. 1 Device 0  . cetirizine (ZYRTEC) 10 MG tablet Take 1 tablet (10 mg total) by mouth daily for 7 days. 10 tablet 11  . Efinaconazole 10 % SOLN Apply 1 application topically daily. (Patient not taking: Reported on 07/13/2018) 8 mL 11  . Menthol, Topical Analgesic, (BIOFREEZE EX) Apply 1 application topically daily as needed (knee pain).    . simvastatin (ZOCOR) 80 MG tablet Take 1 tablet (80 mg total) by mouth daily at 6 PM. 90 tablet 3  . triamcinolone (NASACORT) 55 MCG/ACT AERO nasal inhaler Place 2 sprays into the nose daily. (Patient not taking: Reported on 01/04/2019) 1 Inhaler 12  No current facility-administered medications for this visit.    No Known Allergies  Social History   Socioeconomic History  . Marital status: Married    Spouse name: Not on file  . Number of children: Not on file  . Years of education: Not on file  . Highest education level: Not on file  Occupational History  . Occupation: retired  Tobacco Use  . Smoking status: Former Smoker    Packs/day: 0.25    Years: 10.00    Pack years: 2.50    Quit date: 01/19/1988    Years since quitting: 30.9   . Smokeless tobacco: Never Used  Substance and Sexual Activity  . Alcohol use: No  . Drug use: No  . Sexual activity: Yes  Other Topics Concern  . Not on file  Social History Narrative   Married;   Moved from [place] in 2014   Retired   1 son   Social Determinants of Radio broadcast assistant Strain:   . Difficulty of Paying Living Expenses: Not on file  Food Insecurity:   . Worried About Charity fundraiser in the Last Year: Not on file  . Ran Out of Food in the Last Year: Not on file  Transportation Needs:   . Lack of Transportation (Medical): Not on file  . Lack of Transportation (Non-Medical): Not on file  Physical Activity:   . Days of Exercise per Week: Not on file  . Minutes of Exercise per Session: Not on file  Stress:   . Feeling of Stress : Not on file  Social Connections:   . Frequency of Communication with Friends and Family: Not on file  . Frequency of Social Gatherings with Friends and Family: Not on file  . Attends Religious Services: Not on file  . Active Member of Clubs or Organizations: Not on file  . Attends Archivist Meetings: Not on file  . Marital Status: Not on file  Intimate Partner Violence:   . Fear of Current or Ex-Partner: Not on file  . Emotionally Abused: Not on file  . Physically Abused: Not on file  . Sexually Abused: Not on file    Review of Systems  Constitutional: Negative.  Negative for chills and fever.  HENT: Negative.  Negative for congestion and sore throat.   Respiratory: Positive for cough.   Cardiovascular: Negative.  Negative for chest pain, palpitations and leg swelling.  Gastrointestinal: Negative.  Negative for abdominal pain, diarrhea, nausea and vomiting.  Genitourinary: Positive for dysuria, frequency and urgency. Negative for flank pain and hematuria.  Musculoskeletal: Negative.  Negative for myalgias.  Skin: Negative.  Negative for rash.  Neurological: Negative.  Negative for dizziness and headaches.    Endo/Heme/Allergies: Negative.   All other systems reviewed and are negative.   Objective  Alert and oriented x3 in no apparent respiratory distress. Vitals as reported by the patient: Today's Vitals   01/04/19 1048  BP: 120/80  Temp: 97.8 F (36.6 C)  TempSrc: Oral  Weight: (!) 370 lb (167.8 kg)  Height: 6\' 2"  (1.88 m)    There are no diagnoses linked to this encounter. Jarae was seen today for urinary frequency and cough.  Diagnoses and all orders for this visit:  Dysuria Comments: Suspected UTI Orders: -     ciprofloxacin (CIPRO) 500 MG tablet; Take 1 tablet (500 mg total) by mouth 2 (two) times daily for 7 days.  Urinary frequency   Advised to start taking antibiotic today and  notify the office if no better or worse in the next several days. ED precautions given.  Covid precautions given.  I discussed the assessment and treatment plan with the patient. The patient was provided an opportunity to ask questions and all were answered. The patient agreed with the plan and demonstrated an understanding of the instructions.   The patient was advised to call back or seek an in-person evaluation if the symptoms worsen or if the condition fails to improve as anticipated.  I provided 15 minutes of non-face-to-face time during this encounter.  Horald Pollen, MD  Primary Care at [facility]

## 2019-01-22 ENCOUNTER — Telehealth (INDEPENDENT_AMBULATORY_CARE_PROVIDER_SITE_OTHER): Payer: Medicare HMO | Admitting: Emergency Medicine

## 2019-01-22 ENCOUNTER — Encounter: Payer: Self-pay | Admitting: Emergency Medicine

## 2019-01-22 ENCOUNTER — Telehealth: Payer: Self-pay | Admitting: Emergency Medicine

## 2019-01-22 VITALS — Ht 73.0 in | Wt 360.0 lb

## 2019-01-22 DIAGNOSIS — N4889 Other specified disorders of penis: Secondary | ICD-10-CM | POA: Diagnosis not present

## 2019-01-22 DIAGNOSIS — L089 Local infection of the skin and subcutaneous tissue, unspecified: Secondary | ICD-10-CM

## 2019-01-22 MED ORDER — CEFADROXIL 500 MG PO CAPS: 500.0000 mg | ORAL_CAPSULE | Freq: Two times a day (BID) | ORAL | 0 refills | Status: AC

## 2019-01-22 NOTE — Progress Notes (Signed)
Telemedicine Encounter- SOAP NOTE Established Patient  This telephone encounter was conducted with the patient's (or proxy's) verbal consent via audio telecommunications: yes/no: Yes Patient was instructed to have this encounter in a suitably private space; and to only have persons present to whom they give permission to participate. In addition, patient identity was confirmed by use of name plus two identifiers (DOB and address).  I discussed the limitations, risks, security and privacy concerns of performing an evaluation and management service by telephone and the availability of in person appointments. I also discussed with the patient that there may be a patient responsible charge related to this service. The patient expressed understanding and agreed to proceed.  I spent a total of TIME; 0 MIN TO 60 MIN: 15 minutes talking with the patient or their proxy.  Chief Complaint  Patient presents with  . Penis Pain    per patient it is not the penis, but the foreskin is painful when it is pulled back x 4 days    Subjective   Nathan Fleming is a 64 y.o. male established patient. Telephone visit today for pain to foreskin area that started 4 days ago.  Denies urinary symptoms.  Able to retract foreskin without difficulty and he goes back in place normally.  Noticed some irritation to the area but no discharge.  Urine burns when in contact.  No other significant symptoms.  No history of phimosis or paraphimosis.  HPI   Patient Active Problem List   Diagnosis Date Noted  . Essential hypertension 04/25/2018  . Seasonal allergies 04/25/2018  . Prediabetes 09/26/2017  . Hyperthyroidism 09/26/2013  . Other testicular hypofunction 09/26/2013  . Erectile dysfunction 08/30/2013  . Dyslipidemia 03/04/2013  . Obesity, morbid ([place]) 03/04/2013    Past Medical History:  Diagnosis Date  . Allergy   . Arthritis   . Hyperlipidemia     Current Outpatient Medications  Medication Sig Dispense  Refill  . albuterol (VENTOLIN HFA) 108 (90 Base) MCG/ACT inhaler Inhale 2 puffs into the lungs every 6 (six) hours as needed for wheezing or shortness of breath. 6.7 g 5  . chlorthalidone (HYGROTON) 25 MG tablet Take 0.5 tablets (12.5 mg total) by mouth daily. 45 tablet 3  . Menthol, Topical Analgesic, (BIOFREEZE EX) Apply 1 application topically daily as needed (knee pain).    . rosuvastatin (CRESTOR) 5 MG tablet Take 1 tablet (5 mg total) by mouth daily. Patient to fill this or the simvastatin. 90 tablet 3  . sildenafil (VIAGRA) 100 MG tablet Take 0.5-1 tablets (50-100 mg total) by mouth daily as needed for erectile dysfunction. 7 tablet 11  . triamcinolone (NASACORT) 55 MCG/ACT AERO nasal inhaler Place 2 sprays into the nose daily. 1 Inhaler 12  . Blood Pressure Monitoring (BLOOD PRESSURE MONITOR/WRIST) DEVI 1 Device by Does not apply route daily. 1 Device 0  . cefadroxil (DURICEF) 500 MG capsule Take 1 capsule (500 mg total) by mouth 2 (two) times daily for 7 days. 14 capsule 0  . cetirizine (ZYRTEC) 10 MG tablet Take 1 tablet (10 mg total) by mouth daily for 7 days. 10 tablet 11  . Efinaconazole 10 % SOLN Apply 1 application topically daily. (Patient not taking: Reported on 07/13/2018) 8 mL 11  . simvastatin (ZOCOR) 80 MG tablet Take 1 tablet (80 mg total) by mouth daily at 6 PM. 90 tablet 3   No current facility-administered medications for this visit.    No Known Allergies  Social History   Socioeconomic  History  . Marital status: Married    Spouse name: Not on file  . Number of children: Not on file  . Years of education: Not on file  . Highest education level: Not on file  Occupational History  . Occupation: retired  Tobacco Use  . Smoking status: Former Smoker    Packs/day: 0.25    Years: 10.00    Pack years: 2.50    Quit date: 01/19/1988    Years since quitting: 31.0  . Smokeless tobacco: Never Used  Substance and Sexual Activity  . Alcohol use: No  . Drug use: No  .  Sexual activity: Yes  Other Topics Concern  . Not on file  Social History Narrative   Married;   Moved from [place] in 2014   Retired   1 son   Social Determinants of Radio broadcast assistant Strain:   . Difficulty of Paying Living Expenses: Not on file  Food Insecurity:   . Worried About Charity fundraiser in the Last Year: Not on file  . Ran Out of Food in the Last Year: Not on file  Transportation Needs:   . Lack of Transportation (Medical): Not on file  . Lack of Transportation (Non-Medical): Not on file  Physical Activity:   . Days of Exercise per Week: Not on file  . Minutes of Exercise per Session: Not on file  Stress:   . Feeling of Stress : Not on file  Social Connections:   . Frequency of Communication with Friends and Family: Not on file  . Frequency of Social Gatherings with Friends and Family: Not on file  . Attends Religious Services: Not on file  . Active Member of Clubs or Organizations: Not on file  . Attends Archivist Meetings: Not on file  . Marital Status: Not on file  Intimate Partner Violence:   . Fear of Current or Ex-Partner: Not on file  . Emotionally Abused: Not on file  . Physically Abused: Not on file  . Sexually Abused: Not on file    Review of Systems  Constitutional: Negative.  Negative for fever.  HENT: Negative.  Negative for congestion and sore throat.   Respiratory: Negative.  Negative for cough and shortness of breath.   Cardiovascular: Negative.  Negative for chest pain and palpitations.  Gastrointestinal: Negative.  Negative for abdominal pain, diarrhea, nausea and vomiting.  Genitourinary: Negative.  Negative for dysuria, flank pain and hematuria.  Musculoskeletal: Negative.  Negative for myalgias and neck pain.  Skin: Negative.   Neurological: Negative.  Negative for dizziness and headaches.  Endo/Heme/Allergies: Negative.   All other systems reviewed and are negative.   Objective  Alert and oriented x3 in no  apparent respiratory distress. Vitals as reported by the patient: Today's Vitals   01/22/19 1704  Weight: (!) 360 lb (163.3 kg)  Height: 6\' 1"  (1.854 m)    Nathan Fleming was seen today for penis pain.  Diagnoses and all orders for this visit:  Skin infection -     cefadroxil (DURICEF) 500 MG capsule; Take 1 capsule (500 mg total) by mouth 2 (two) times daily for 7 days.  Penis pain  Irritation of prepuce   Clinically stable.  No red flag signs or symptoms.  No description of phimosis or paraphimosis. Office visit this week.  I discussed the assessment and treatment plan with the patient. The patient was provided an opportunity to ask questions and all were answered. The patient agreed with the  plan and demonstrated an understanding of the instructions.   The patient was advised to call back or seek an in-person evaluation if the symptoms worsen or if the condition fails to improve as anticipated.  I provided 15 minutes of non-face-to-face time during this encounter.  Horald Pollen, MD  Primary Care at [facility]

## 2019-01-23 ENCOUNTER — Telehealth: Payer: Self-pay | Admitting: Emergency Medicine

## 2019-01-23 ENCOUNTER — Ambulatory Visit: Payer: Medicare HMO | Attending: Internal Medicine

## 2019-01-23 ENCOUNTER — Other Ambulatory Visit: Payer: Self-pay

## 2019-01-23 DIAGNOSIS — Z20822 Contact with and (suspected) exposure to covid-19: Secondary | ICD-10-CM | POA: Diagnosis not present

## 2019-01-23 NOTE — Telephone Encounter (Signed)
Called pt LVM for him to call the office . No details . Looks like Dr.Sagardia wants to see pt in office after virtual appt on 01/21/2018  Fr

## 2019-01-23 NOTE — Progress Notes (Signed)
Called pt LVM for pt to call our off . Ni details when pt calls back please schedule IN OFFICE VISIT per Dr.Sagardias notes from virtual visit FR

## 2019-01-25 LAB — NOVEL CORONAVIRUS, NAA: SARS-CoV-2, NAA: NOT DETECTED

## 2019-01-31 ENCOUNTER — Telehealth: Payer: Self-pay | Admitting: *Deleted

## 2019-01-31 NOTE — Telephone Encounter (Signed)
Schedule AWV.  

## 2019-02-13 ENCOUNTER — Telehealth: Payer: Self-pay | Admitting: Emergency Medicine

## 2019-02-13 ENCOUNTER — Ambulatory Visit (INDEPENDENT_AMBULATORY_CARE_PROVIDER_SITE_OTHER): Payer: Medicare HMO | Admitting: Emergency Medicine

## 2019-02-13 ENCOUNTER — Other Ambulatory Visit: Payer: Self-pay

## 2019-02-13 ENCOUNTER — Encounter: Payer: Self-pay | Admitting: Emergency Medicine

## 2019-02-13 VITALS — BP 140/82 | HR 92 | Temp 97.9°F | Wt 360.2 lb

## 2019-02-13 DIAGNOSIS — Z6841 Body Mass Index (BMI) 40.0 and over, adult: Secondary | ICD-10-CM

## 2019-02-13 DIAGNOSIS — N481 Balanitis: Secondary | ICD-10-CM | POA: Diagnosis not present

## 2019-02-13 DIAGNOSIS — I10 Essential (primary) hypertension: Secondary | ICD-10-CM | POA: Diagnosis not present

## 2019-02-13 LAB — CBC WITH DIFFERENTIAL/PLATELET
Basophils Absolute: 0 10*3/uL (ref 0.0–0.2)
Basos: 0 %
EOS (ABSOLUTE): 0 10*3/uL (ref 0.0–0.4)
Eos: 0 %
Hematocrit: 44.9 % (ref 37.5–51.0)
Hemoglobin: 14.9 g/dL (ref 13.0–17.7)
Immature Grans (Abs): 0 10*3/uL (ref 0.0–0.1)
Immature Granulocytes: 1 %
Lymphocytes Absolute: 1.4 10*3/uL (ref 0.7–3.1)
Lymphs: 28 %
MCH: 28.3 pg (ref 26.6–33.0)
MCHC: 33.2 g/dL (ref 31.5–35.7)
MCV: 85 fL (ref 79–97)
Monocytes Absolute: 0.6 10*3/uL (ref 0.1–0.9)
Monocytes: 12 %
Neutrophils Absolute: 3 10*3/uL (ref 1.4–7.0)
Neutrophils: 59 %
Platelets: 228 10*3/uL (ref 150–450)
RBC: 5.26 x10E6/uL (ref 4.14–5.80)
RDW: 11.8 % (ref 11.6–15.4)
WBC: 5.1 10*3/uL (ref 3.4–10.8)

## 2019-02-13 LAB — COMPREHENSIVE METABOLIC PANEL
ALT: 24 IU/L (ref 0–44)
AST: 27 IU/L (ref 0–40)
Albumin/Globulin Ratio: 1.3 (ref 1.2–2.2)
Albumin: 4.1 g/dL (ref 3.8–4.8)
Alkaline Phosphatase: 112 IU/L (ref 39–117)
BUN/Creatinine Ratio: 12 (ref 10–24)
BUN: 10 mg/dL (ref 8–27)
Bilirubin Total: 0.9 mg/dL (ref 0.0–1.2)
CO2: 24 mmol/L (ref 20–29)
Calcium: 9 mg/dL (ref 8.6–10.2)
Chloride: 100 mmol/L (ref 96–106)
Creatinine, Ser: 0.86 mg/dL (ref 0.76–1.27)
GFR calc Af Amer: 106 mL/min/{1.73_m2} (ref >59)
GFR calc non Af Amer: 92 mL/min/{1.73_m2} (ref >59)
Globulin, Total: 3.2 g/dL (ref 1.5–4.5)
Glucose: 92 mg/dL (ref 65–99)
Potassium: 4.2 mmol/L (ref 3.5–5.2)
Sodium: 138 mmol/L (ref 134–144)
Total Protein: 7.3 g/dL (ref 6.0–8.5)

## 2019-02-13 LAB — HEMOGLOBIN A1C
Est. average glucose Bld gHb Est-mCnc: 117 mg/dL
Hgb A1c MFr Bld: 5.7 % — ABNORMAL HIGH (ref 4.8–5.6)

## 2019-02-13 MED ORDER — CLOTRIMAZOLE-BETAMETHASONE 1-0.05 % EX CREA: 1.0000 "application " | TOPICAL_CREAM | Freq: Two times a day (BID) | CUTANEOUS | 1 refills | Status: DC

## 2019-02-13 MED ORDER — FLUCONAZOLE 150 MG PO TABS: 150.0000 mg | ORAL_TABLET | Freq: Every day | ORAL | 0 refills | Status: AC

## 2019-02-13 NOTE — Telephone Encounter (Signed)
Jacquelyn with [facility] would like a call back to discuss the   fluconazole (DIFLUCAN) 150 MG tablet  and possible interaction with the pt's simvastatin (ZOCOR) 80 MG tablet  She would like to know if pt instructed to hold the simvastatin before she proceeds to dispense. Please call back. .   [id number] [address], [place] Phone:  [phone]  Fax:  [phone]

## 2019-02-13 NOTE — Progress Notes (Signed)
Lab Results  Component Value Date   HGBA1C 5.7 (H) 09/01/2017   Pietro Cassis 64 y.o.   Chief Complaint  Patient presents with  . genital issues    patient states he have problems in the penial area been going on for 3 weeks    HISTORY OF PRESENT ILLNESS: This is a 64 y.o. male complaining of soreness to foreskin on his penis on and off for the past 3 weeks. No other significant symptoms.  No history of diabetes.  HPI   Prior to Admission medications   Medication Sig Start Date End Date Taking? Authorizing Provider  albuterol (VENTOLIN HFA) 108 (90 Base) MCG/ACT inhaler Inhale 2 puffs into the lungs every 6 (six) hours as needed for wheezing or shortness of breath. 07/13/18  Yes Elfida Shimada, Ines Bloomer, MD  Blood Pressure Monitoring (BLOOD PRESSURE MONITOR/WRIST) DEVI 1 Device by Does not apply route daily. 08/30/13  Yes Barton Fanny, MD  chlorthalidone (HYGROTON) 25 MG tablet Take 0.5 tablets (12.5 mg total) by mouth daily. 07/13/18  Yes Lydiana Milley, Ines Bloomer, MD  Menthol, Topical Analgesic, (BIOFREEZE EX) Apply 1 application topically daily as needed (knee pain).   Yes [provider]  rosuvastatin (CRESTOR) 5 MG tablet Take 1 tablet (5 mg total) by mouth daily. Patient to fill this or the simvastatin. 09/01/17  Yes Tereasa Coop, PA-C  sildenafil (VIAGRA) 100 MG tablet Take 0.5-1 tablets (50-100 mg total) by mouth daily as needed for erectile dysfunction. 07/13/18  Yes Hadlyn Amero, Ines Bloomer, MD  triamcinolone (NASACORT) 55 MCG/ACT AERO nasal inhaler Place 2 sprays into the nose daily. 04/25/18  Yes Weston Kallman, Ines Bloomer, MD  cetirizine (ZYRTEC) 10 MG tablet Take 1 tablet (10 mg total) by mouth daily for 7 days. 04/25/18 05/02/18  Horald Pollen, MD  simvastatin (ZOCOR) 80 MG tablet Take 1 tablet (80 mg total) by mouth daily at 6 PM. 07/13/18 10/11/18  Horald Pollen, MD    No Known Allergies  Patient Active Problem List   Diagnosis Date Noted  . Essential  hypertension 04/25/2018  . Seasonal allergies 04/25/2018  . Prediabetes 09/26/2017  . Hyperthyroidism 09/26/2013  . Other testicular hypofunction 09/26/2013  . Erectile dysfunction 08/30/2013  . Dyslipidemia 03/04/2013  . Obesity, morbid ([place]) 03/04/2013    Past Medical History:  Diagnosis Date  . Allergy   . Arthritis   . Hyperlipidemia     Past Surgical History:  Procedure Laterality Date  . HIP SURGERY    . KNEE SURGERY    . SHOULDER SURGERY      Social History   Socioeconomic History  . Marital status: Married    Spouse name: Not on file  . Number of children: Not on file  . Years of education: Not on file  . Highest education level: Not on file  Occupational History  . Occupation: retired  Tobacco Use  . Smoking status: Former Smoker    Packs/day: 0.25    Years: 10.00    Pack years: 2.50    Quit date: 01/19/1988    Years since quitting: 31.0  . Smokeless tobacco: Never Used  Substance and Sexual Activity  . Alcohol use: No  . Drug use: No  . Sexual activity: Yes  Other Topics Concern  . Not on file  Social History Narrative   Married;   Moved from [place] in 2014   Retired   1 son   Social Determinants of Radio broadcast assistant Strain:   . Difficulty  of Paying Living Expenses: Not on file  Food Insecurity:   . Worried About Charity fundraiser in the Last Year: Not on file  . Ran Out of Food in the Last Year: Not on file  Transportation Needs:   . Lack of Transportation (Medical): Not on file  . Lack of Transportation (Non-Medical): Not on file  Physical Activity:   . Days of Exercise per Week: Not on file  . Minutes of Exercise per Session: Not on file  Stress:   . Feeling of Stress : Not on file  Social Connections:   . Frequency of Communication with Friends and Family: Not on file  . Frequency of Social Gatherings with Friends and Family: Not on file  . Attends Religious Services: Not on file  . Active Member of Clubs or Organizations:  Not on file  . Attends Archivist Meetings: Not on file  . Marital Status: Not on file  Intimate Partner Violence:   . Fear of Current or Ex-Partner: Not on file  . Emotionally Abused: Not on file  . Physically Abused: Not on file  . Sexually Abused: Not on file    Family History  Problem Relation Age of Onset  . Diabetes Mother   . Stroke Father   . Diabetes Sister   . Thyroid disease Neg Hx      Review of Systems  Constitutional: Negative.  Negative for chills and fever.  HENT: Negative.  Negative for congestion and sore throat.   Eyes: Positive for blurred vision (Chronic for 7 years).  Respiratory: Negative.  Negative for cough and shortness of breath.   Cardiovascular: Negative.  Negative for chest pain and palpitations.  Gastrointestinal: Negative.  Negative for abdominal pain, blood in stool, diarrhea, nausea and vomiting.  Genitourinary: Negative.  Negative for dysuria and hematuria.  Musculoskeletal: Negative.  Negative for myalgias and neck pain.  Skin: Negative.   Neurological: Negative.  Negative for dizziness and headaches.  All other systems reviewed and are negative.  Today's Vitals   02/13/19 0935 02/13/19 0948  BP: (!) 160/91 140/82  Pulse: 92   Temp: 97.9 F (36.6 C)   TempSrc: Temporal   SpO2: 97%   Weight: (!) 360 lb 3.2 oz (163.4 kg)    Body mass index is 47.52 kg/m.   Physical Exam Vitals reviewed.  Constitutional:      Appearance: He is obese.  HENT:     Head: Normocephalic.  Eyes:     Extraocular Movements: Extraocular movements intact.     Pupils: Pupils are equal, round, and reactive to light.  Cardiovascular:     Rate and Rhythm: Normal rate.  Pulmonary:     Effort: Pulmonary effort is normal.  Abdominal:     Palpations: Abdomen is soft.     Tenderness: There is no abdominal tenderness.     Hernia: There is no hernia in the left inguinal area or right inguinal area.  Genitourinary:    Penis: Uncircumcised. Erythema  and lesions (Small multiple fissures on foreskin with erythema) present. No phimosis, paraphimosis or discharge.      Testes: Normal.  Musculoskeletal:        General: Normal range of motion.     Right lower leg: Edema present.     Left lower leg: Edema present.  Lymphadenopathy:     Lower Body: No right inguinal adenopathy. No left inguinal adenopathy.  Skin:    General: Skin is warm and dry.  Neurological:  General: No focal deficit present.     Mental Status: He is alert and oriented to person, place, and time.  Psychiatric:        Mood and Affect: Mood normal.        Behavior: Behavior normal.      ASSESSMENT & PLAN: Cinsere was seen today for genital issues.  Diagnoses and all orders for this visit:  Balanitis -     Hemoglobin A1c -     CBC with Differential/Platelet -     fluconazole (DIFLUCAN) 150 MG tablet; Take 1 tablet (150 mg total) by mouth daily for 3 doses. -     clotrimazole-betamethasone (LOTRISONE) cream; Apply 1 application topically 2 (two) times daily. -     Ambulatory referral to Urology  Essential hypertension -     Comprehensive metabolic panel  Morbid obesity with body mass index (BMI) of 45.0 to 49.9 in adult [facility])    Patient Instructions       If you have lab work done today you will be contacted with your lab results within the next 2 weeks.  If you have not heard from [place] then please contact us. The fastest way to get your results is to register for My Chart.   IF you received an x-ray today, you will receive an invoice from [facility]. Please contact [facility] at [phone] with questions or concerns regarding your invoice.   IF you received labwork today, you will receive an invoice from [facility]. Please contact [facility] at [phone] with questions or concerns regarding your invoice.   Our billing staff will not be able to assist you with questions regarding bills from these companies.  You will be  contacted with the lab results as soon as they are available. The fastest way to get your results is to activate your My Chart account. Instructions are located on the last page of this paperwork. If you have not heard from [place] regarding the results in 2 weeks, please contact this office.      Balanitis  Balanitis is swelling and irritation (inflammation) of the head of the penis (glans penis). The condition may also cause inflammation of the skin around the glans penis (foreskin) in men who have not been circumcised. It may develop because of an infection or another medical condition. Balanitis occurs most often among men who have not had their foreskin removed (uncircumcised men). Balanitis sometimes causes scarring of the penis or foreskin, which can require surgery. Untreated balanitis can increase the risk of penile cancer. What are the causes? Common causes of this condition include:  Poor personal hygiene, especially in uncircumcised men. Not cleaning the glans penis and foreskin well can result in buildup of bacteria, viruses, and yeast, which can lead to infection and inflammation.  Irritation and lack of air flow due to fluid (smegma) that can build up on the glans penis. Other causes include:  Chemical irritation from products such as soaps or shower gels (especially those that have fragrance), condoms, personal lubricants, petroleum jelly, spermicides, or fabric softeners.  Skin conditions, such as eczema, dermatitis, and psoriasis.  Allergies to medicines, such as tetracycline and sulfa drugs.  Certain medical conditions, including liver cirrhosis, congestive heart failure, diabetes, and kidney disease.  Infections, such as candidiasis, HPV (human papillomavirus), herpes simplex, gonorrhea, and syphilis.  Severe obesity. What increases the risk? The following factors may make you more likely to develop this condition:  Having diabetes. This is the most common risk  factor.  Having a tight foreskin that is difficult to pull back (retract) past the glans.  Having sexual intercourse without using a condom. What are the signs or symptoms? Symptoms of this condition include:  Discharge from under the foreskin.  A bad smell.  Pain or difficulty retracting the foreskin.  Tenderness, redness, and swelling of the glans.  A rash or sores on the glans or foreskin.  Itchiness.  Inability to get an erection due to pain.  Difficulty urinating.  Scarring of the penis or foreskin, in some cases. How is this diagnosed? This condition may be diagnosed based on:  A physical exam.  Testing a swab of discharge to check for bacterial or fungal infection.  Blood tests: ? To check for viruses that can cause balanitis. ? To check your blood sugar (glucose) level. High blood glucose could be a sign of diabetes, which can cause balanitis. How is this treated? Treatment for balanitis depends on the cause. Treatment may include:  Improving personal hygiene. Your health care provider may recommend sitting in a bath of warm water that is deep enough to cover your hips and buttocks (sitz bath).  Medicines such as: ? Creams or ointments to reduce swelling (steroids) or to treat an infection. ? Antibiotic medicine. ? Antifungal medicine.  Surgery to remove or cut the foreskin (circumcision). This may be done if you have scarring on the foreskin that makes it difficult to retract.  Controlling other medical problems that may be causing your condition or making it worse. Follow these instructions at home:  Do not have sex until the condition clears up, or until your health care provider approves.  Keep your penis clean and dry. Take sitz baths as recommended by your health care provider.  Avoid products that irritate your skin or make symptoms worse, such as soaps and shower gels that have fragrance.  Take over-the-counter and prescription medicines only as  told by your health care provider. ? If you were prescribed an antibiotic medicine or a cream or ointment, use it as told by your health care provider. Do not stop using your medicine, cream, or ointment even if you start to feel better. ? Do not drive or use heavy machinery while taking prescription pain medicine. Contact a health care provider if:  Your symptoms get worse or do not improve with home care.  You develop chills or a fever.  You have trouble urinating.  You cannot retract your foreskin. Get help right away if:  You develop severe pain.  You are unable to urinate. Summary  Balanitis is inflammation of the head of the penis (glans penis) caused by irritation or infection.  Balanitis causes pain, redness, and swelling of the glans penis.  This condition is most common among uncircumcised men who do not keep their glans penis clean and in men who have diabetes.  Treatment may include creams or ointments.  Good hygiene is important for prevention. This includes pulling back the foreskin when washing your penis. This information is not intended to replace advice given to you by your health care provider. Make sure you discuss any questions you have with your health care provider. Document Revised: 12/17/2016 Document Reviewed: 11/24/2015 Elsevier Patient Education  2020 Elsevier Inc.      Agustina Caroli, MD [facility]

## 2019-02-13 NOTE — Patient Instructions (Addendum)
If you have lab work done today you will be contacted with your lab results within the next 2 weeks.  If you have not heard from [place] then please contact us. The fastest way to get your results is to register for My Chart.   IF you received an x-ray today, you will receive an invoice from [facility]. Please contact [facility] at [phone] with questions or concerns regarding your invoice.   IF you received labwork today, you will receive an invoice from [place]. Please contact [facility] at [phone] with questions or concerns regarding your invoice.   Our billing staff will not be able to assist you with questions regarding bills from these companies.  You will be contacted with the lab results as soon as they are available. The fastest way to get your results is to activate your My Chart account. Instructions are located on the last page of this paperwork. If you have not heard from [place] regarding the results in 2 weeks, please contact this office.      Balanitis  Balanitis is swelling and irritation (inflammation) of the head of the penis (glans penis). The condition may also cause inflammation of the skin around the glans penis (foreskin) in men who have not been circumcised. It may develop because of an infection or another medical condition. Balanitis occurs most often among men who have not had their foreskin removed (uncircumcised men). Balanitis sometimes causes scarring of the penis or foreskin, which can require surgery. Untreated balanitis can increase the risk of penile cancer. What are the causes? Common causes of this condition include:  Poor personal hygiene, especially in uncircumcised men. Not cleaning the glans penis and foreskin well can result in buildup of bacteria, viruses, and yeast, which can lead to infection and inflammation.  Irritation and lack of air flow due to fluid (smegma) that can build up on the glans penis. Other causes  include:  Chemical irritation from products such as soaps or shower gels (especially those that have fragrance), condoms, personal lubricants, petroleum jelly, spermicides, or fabric softeners.  Skin conditions, such as eczema, dermatitis, and psoriasis.  Allergies to medicines, such as tetracycline and sulfa drugs.  Certain medical conditions, including liver cirrhosis, congestive heart failure, diabetes, and kidney disease.  Infections, such as candidiasis, HPV (human papillomavirus), herpes simplex, gonorrhea, and syphilis.  Severe obesity. What increases the risk? The following factors may make you more likely to develop this condition:  Having diabetes. This is the most common risk factor.  Having a tight foreskin that is difficult to pull back (retract) past the glans.  Having sexual intercourse without using a condom. What are the signs or symptoms? Symptoms of this condition include:  Discharge from under the foreskin.  A bad smell.  Pain or difficulty retracting the foreskin.  Tenderness, redness, and swelling of the glans.  A rash or sores on the glans or foreskin.  Itchiness.  Inability to get an erection due to pain.  Difficulty urinating.  Scarring of the penis or foreskin, in some cases. How is this diagnosed? This condition may be diagnosed based on:  A physical exam.  Testing a swab of discharge to check for bacterial or fungal infection.  Blood tests: ? To check for viruses that can cause balanitis. ? To check your blood sugar (glucose) level. High blood glucose could be a sign of diabetes, which can cause balanitis. How is this treated? Treatment for balanitis depends on the cause. Treatment may  include:  Improving personal hygiene. Your health care provider may recommend sitting in a bath of warm water that is deep enough to cover your hips and buttocks (sitz bath).  Medicines such as: ? Creams or ointments to reduce swelling (steroids) or  to treat an infection. ? Antibiotic medicine. ? Antifungal medicine.  Surgery to remove or cut the foreskin (circumcision). This may be done if you have scarring on the foreskin that makes it difficult to retract.  Controlling other medical problems that may be causing your condition or making it worse. Follow these instructions at home:  Do not have sex until the condition clears up, or until your health care provider approves.  Keep your penis clean and dry. Take sitz baths as recommended by your health care provider.  Avoid products that irritate your skin or make symptoms worse, such as soaps and shower gels that have fragrance.  Take over-the-counter and prescription medicines only as told by your health care provider. ? If you were prescribed an antibiotic medicine or a cream or ointment, use it as told by your health care provider. Do not stop using your medicine, cream, or ointment even if you start to feel better. ? Do not drive or use heavy machinery while taking prescription pain medicine. Contact a health care provider if:  Your symptoms get worse or do not improve with home care.  You develop chills or a fever.  You have trouble urinating.  You cannot retract your foreskin. Get help right away if:  You develop severe pain.  You are unable to urinate. Summary  Balanitis is inflammation of the head of the penis (glans penis) caused by irritation or infection.  Balanitis causes pain, redness, and swelling of the glans penis.  This condition is most common among uncircumcised men who do not keep their glans penis clean and in men who have diabetes.  Treatment may include creams or ointments.  Good hygiene is important for prevention. This includes pulling back the foreskin when washing your penis. This information is not intended to replace advice given to you by your health care provider. Make sure you discuss any questions you have with your health care  provider. Document Revised: 12/17/2016 Document Reviewed: 11/24/2015 Elsevier Patient Education  2020 [employer].

## 2019-02-14 ENCOUNTER — Encounter: Payer: Self-pay | Admitting: Emergency Medicine

## 2019-02-15 NOTE — Telephone Encounter (Signed)
Please advise if patient needs to stop simvastatin while taking fluconazole.

## 2019-02-15 NOTE — Telephone Encounter (Signed)
I already spoke to the pharmacist regarding this matter while in the room with patient during the office visit.  Plan was to stop simvastatin for 3 days while patient was taking fluconazole.  Thanks.

## 2019-02-23 ENCOUNTER — Ambulatory Visit: Payer: Self-pay | Admitting: *Deleted

## 2019-02-23 NOTE — Telephone Encounter (Signed)
I returned his call.   He is scheduled to have his first dose of the COVID vaccine tomorrow 02/24/2019.     He is wondering if he should still get the shot or not.  Yesterday he had a fever of 99.6, a "slight cough and sneezing".   He has not checked his temperature today but still has the cough and sneezing.  I let him know he should not go for the vaccine tomorrow since he is showing symptoms.   If he is contagious he doesn't need to be around other people.    I instructed him to cancel his vaccine appt.   I offered to assist him in making an appt to be tested.    He does not drive so needs to check with his son when he could take Nathan Fleming to be tested.   "It needs to be Monday".   "I don't have a way today to get to the testing site".    He verbalized understanding.   His vaccine is scheduled at [facility].    He is going to call and cancel.  I gave him the web site [email or web address] to schedule his COVID-19 test after checking with his son.   I asked if he had a computer and felt comfortable with going on line and scheduling the appt himself and he responded,   "Yes, I have a computer and can do that".    I let him know if he had any problem with getting scheduled to call us back and we would be glad to assist him with getting scheduled.    I let him know to quarantine until he gets his test result back in case he does have the virus.  He is contagious and can spread the virus if he is positive for it.  Her verbalized understanding and thanked me for my help.

## 2019-04-06 ENCOUNTER — Other Ambulatory Visit: Payer: Self-pay | Admitting: Emergency Medicine

## 2019-04-06 DIAGNOSIS — J45998 Other asthma: Secondary | ICD-10-CM

## 2019-04-06 MED ORDER — ALBUTEROL SULFATE HFA 108 (90 BASE) MCG/ACT IN AERS: 2.0000 | INHALATION_SPRAY | Freq: Four times a day (QID) | RESPIRATORY_TRACT | 5 refills | Status: DC | PRN

## 2019-04-06 NOTE — Telephone Encounter (Signed)
Medication Refill - Medication: albuterol (VENTOLIN HFA) 108 (90 Base) MCG/ACT inhaler [id number]    Preferred Pharmacy (with phone number or street name):  [id number] [address]  [place] [address]  Phone: [phone] Fax: [phone]     Agent: Please be advised that RX refills may take up to 3 business days. We ask that you follow-up with your pharmacy.

## 2019-04-27 IMAGING — DX DG CHEST 2V
3 series · 3 of 3 positions shown · non-contrast
Comparison: None.

CLINICAL DATA: Patient with hypertension.

EXAM:
CHEST  2 VIEW

[chest pa (1 of 2)]
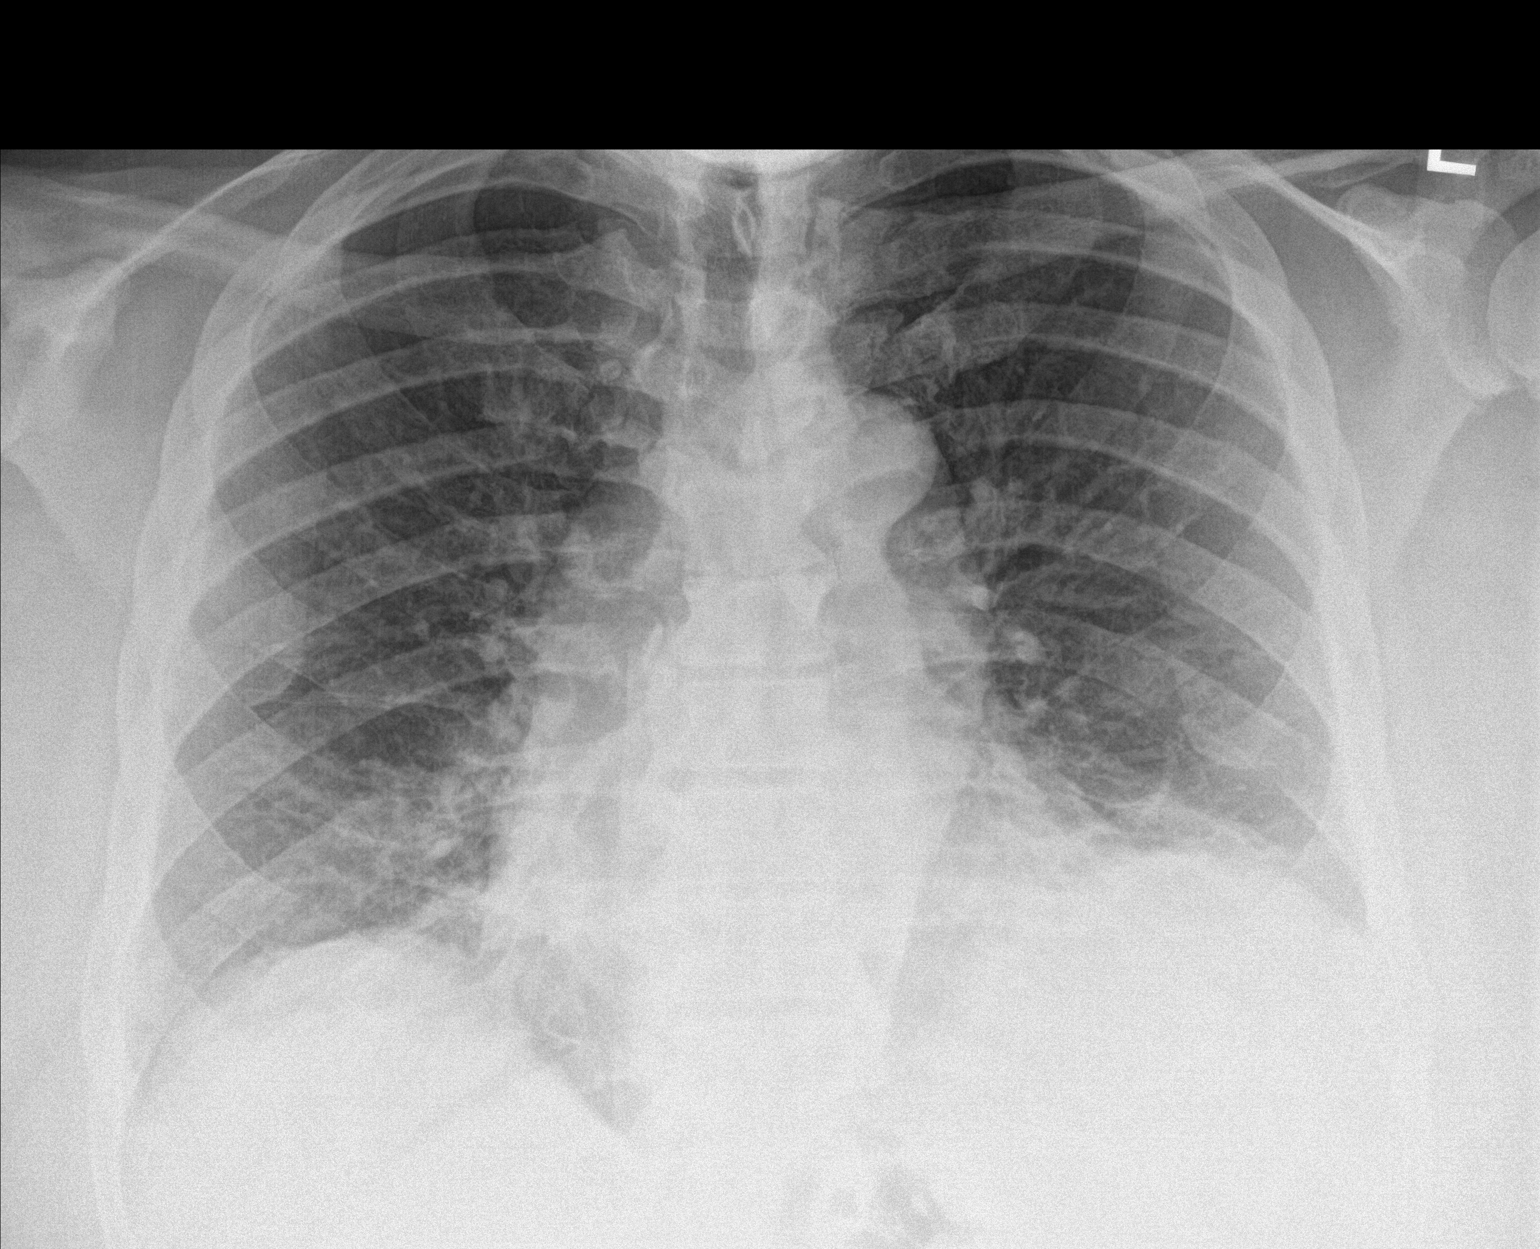

[chest lat]
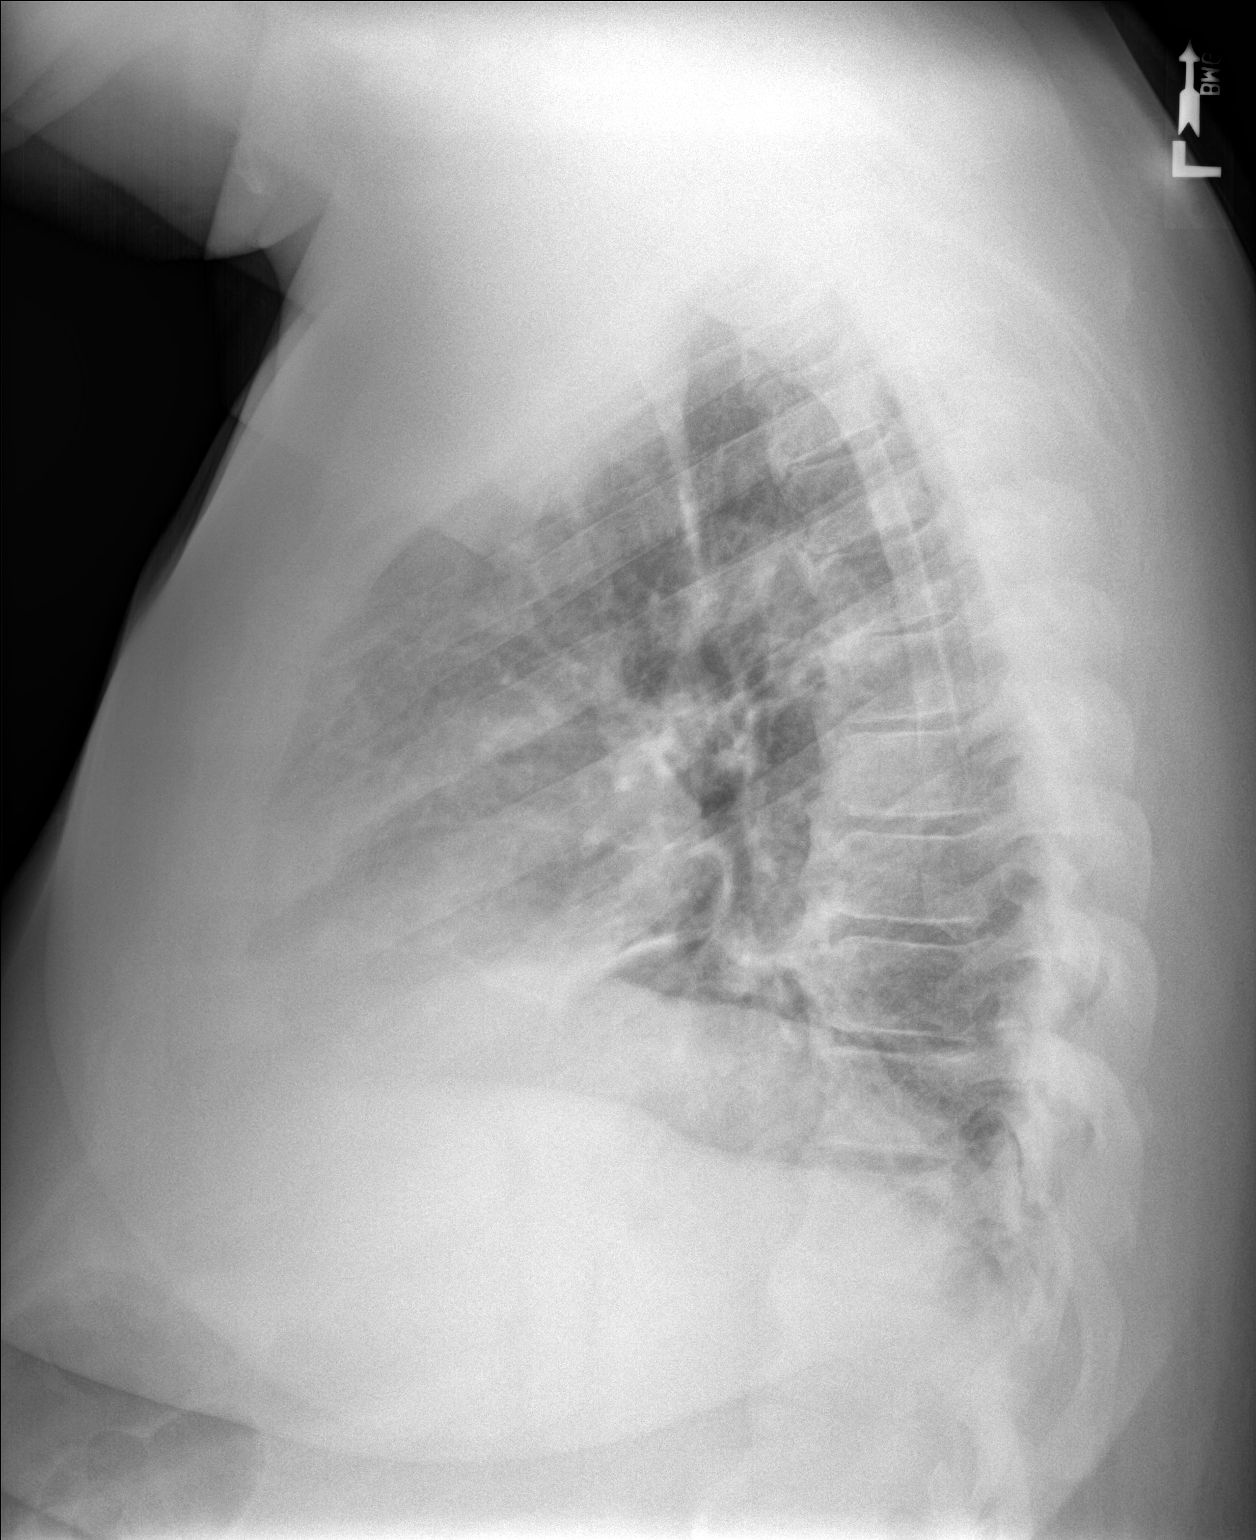

[chest pa (2 of 2)]
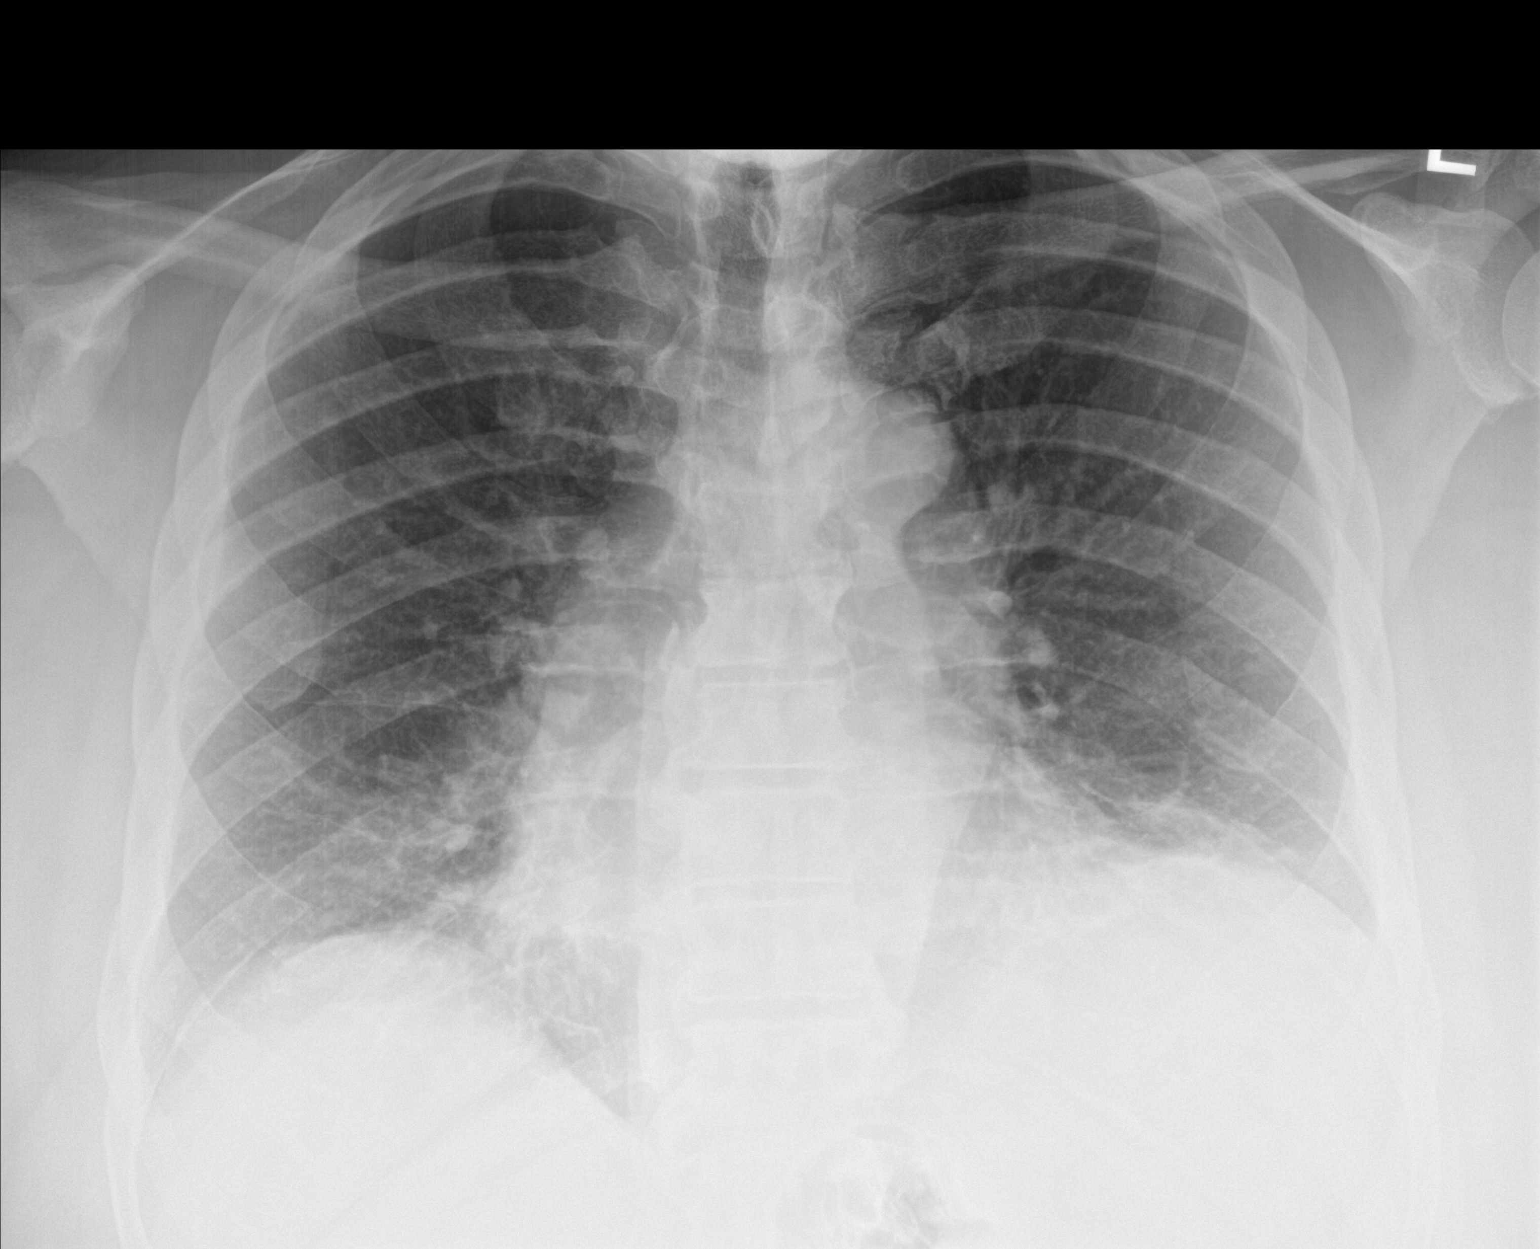

[3 of 3 positions shown; findings below may reference images not displayed]

FINDINGS: Cardiomegaly. Tortuosity of the thoracic aorta. Low lung volumes.
Bibasilar heterogeneous pulmonary opacities. No pleural effusion
pneumothorax. Thoracic spine degenerative changes.
IMPRESSION: Low lung volumes with basilar heterogeneous opacities which may
represent atelectasis or infection.

Cardiomegaly.

## 2019-05-01 NOTE — Telephone Encounter (Signed)
No additional notes

## 2019-07-18 ENCOUNTER — Telehealth: Payer: Self-pay | Admitting: Emergency Medicine

## 2019-07-18 NOTE — Telephone Encounter (Signed)
Pt called and pt had the johnson and johnson covid shot in march and is wondering if he will need to get a buster shot. Pt would like a call in regards to this. Please advise.

## 2019-07-18 NOTE — Telephone Encounter (Signed)
Spoke to patient, he stated because there is talk of the new Delta virus he would need a booster vaccine, he got the Covid vaccines in March. I advised him he would not need a booster because viruses have new variants changing all the time. The CDC has mentioned any boosters.

## 2019-07-19 ENCOUNTER — Other Ambulatory Visit: Payer: Self-pay | Admitting: Emergency Medicine

## 2019-07-19 DIAGNOSIS — E785 Hyperlipidemia, unspecified: Secondary | ICD-10-CM

## 2019-07-19 DIAGNOSIS — I10 Essential (primary) hypertension: Secondary | ICD-10-CM

## 2019-10-31 ENCOUNTER — Encounter: Payer: Self-pay | Admitting: Emergency Medicine

## 2019-10-31 DIAGNOSIS — R69 Illness, unspecified: Secondary | ICD-10-CM | POA: Diagnosis not present

## 2019-11-09 ENCOUNTER — Other Ambulatory Visit: Payer: Self-pay | Admitting: Emergency Medicine

## 2019-11-09 DIAGNOSIS — E785 Hyperlipidemia, unspecified: Secondary | ICD-10-CM

## 2019-11-09 DIAGNOSIS — I10 Essential (primary) hypertension: Secondary | ICD-10-CM

## 2019-11-09 NOTE — Telephone Encounter (Signed)
Requested medications are due for refill today yes  Requested medications are on the active medication list yes  Last refill 7/30  Future visit scheduled no  Notes to clinic Failed protocol of valid visit within 12 months, labs out of date, no visit scheduled.

## 2019-11-12 NOTE — Telephone Encounter (Signed)
No further refills without office visit 

## 2019-11-12 NOTE — Telephone Encounter (Signed)
Please schedule patient an f/u and  med refill appt. 90 days has been sent

## 2019-11-13 NOTE — Telephone Encounter (Signed)
11/13/2019 - PATIENT REQUESTING REFILLS ON HIS SIMVASTATIN 80 mg AND CHLORTHALIDONE 25 mg. FELICIA K. HAS GIVEN HIM A 90 DAY SUPPLY. I HAVE SCHEDULED HIS FOLLOW-UP OFFICE VISIT WITH DR. Kittie Plater FOR Tuesday (12/18/2019) AT 8:40 pm. I DO NOT NEED TO ROUTE THIS MESSAGE AT THIS TIME. [place]

## 2019-12-06 ENCOUNTER — Telehealth: Payer: Self-pay | Admitting: Emergency Medicine

## 2019-12-06 NOTE — Telephone Encounter (Signed)
12/06/2019 - PATIENT STATES HE GOT HIS COVID 19 VACCINE WITH JOHNSON AND JOHNSON WHICH HE HAD DONE IN MARCH 2021. HE WOULD LIKE TO ASK DR. MIGUEL IF HE NEEDS TO GET HIS BOOSTER WITH JOH[employer] JOH[employer]ON ALSO OR IF HE CAN GET A DIFFERENT BRAND?  BEST PHONE [phone] (CELL) [place]

## 2019-12-06 NOTE — Telephone Encounter (Signed)
Spoke with pt regarding concern of the booster shot. Pt received J&Jjvaccine in March 2021. Needed clarification on which booster to get. Per cdc guidelines, either pfizer or moderna would be fine. Pt understands that there is no booster for J&J

## 2019-12-18 ENCOUNTER — Ambulatory Visit: Payer: Medicare HMO | Admitting: Emergency Medicine

## 2019-12-19 ENCOUNTER — Ambulatory Visit: Payer: Medicare HMO | Admitting: Emergency Medicine

## 2020-02-02 ENCOUNTER — Other Ambulatory Visit: Payer: Self-pay | Admitting: Emergency Medicine

## 2020-02-02 DIAGNOSIS — E785 Hyperlipidemia, unspecified: Secondary | ICD-10-CM

## 2020-02-02 DIAGNOSIS — I10 Essential (primary) hypertension: Secondary | ICD-10-CM

## 2020-02-28 ENCOUNTER — Other Ambulatory Visit: Payer: Self-pay | Admitting: Emergency Medicine

## 2020-02-28 DIAGNOSIS — E785 Hyperlipidemia, unspecified: Secondary | ICD-10-CM

## 2020-02-28 NOTE — Telephone Encounter (Signed)
Requested medication (s) are due for refill today: yes  Requested medication (s) are on the active medication list: yes  Last refill:  02/03/2020  Future visit scheduled: no  Notes to clinic:  overdue for office visit   Requested Prescriptions  Pending Prescriptions Disp Refills   simvastatin (ZOCOR) 80 MG tablet [Pharmacy Med Name: SIMVASTATIN 80 MG TABLET] 30 tablet 0    Sig: TAKE 1 TABLET BY MOUTH DAILY AT 6PM      Cardiovascular:  Antilipid - Statins Failed - 02/28/2020 11:31 AM      Failed - Total Cholesterol in normal range and within 360 days    Cholesterol, Total  Date Value Ref Range Status  09/10/2016 198 100 - 199 mg/dL Final          Failed - LDL in normal range and within 360 days    LDL Calculated  Date Value Ref Range Status  09/10/2016 130 (H) 0 - 99 mg/dL Final          Failed - HDL in normal range and within 360 days    HDL  Date Value Ref Range Status  09/10/2016 43 >39 mg/dL Final          Failed - Triglycerides in normal range and within 360 days    Triglycerides  Date Value Ref Range Status  09/10/2016 125 0 - 149 mg/dL Final          Failed - Valid encounter within last 12 months    Recent Outpatient Visits           1 year ago Balanitis   [facility] [place], Ines Bloomer, MD   1 year ago Skin infection   [facility], [place]   1 year ago Dysuria   [facility] at [place], Ines Bloomer, MD   1 year ago Seasonal asthma   [facility] [place], Ines Bloomer, MD   1 year ago Essential hypertension   [facility] [place], [place], [place]                [place] - Patient is not pregnant

## 2020-02-29 NOTE — Telephone Encounter (Signed)
Left message on patient's voicemail requesting call back to schedule appt for med refill.

## 2020-02-29 NOTE — Telephone Encounter (Signed)
Called patient to schedule appointment for med refill and to inform provider will give courtesy refill for enough doses until appointment. No answer; couldn't leave a message (voicemail full). Called two times.

## 2020-04-10 ENCOUNTER — Telehealth: Payer: Self-pay | Admitting: Emergency Medicine

## 2020-04-10 NOTE — Telephone Encounter (Signed)
Patient called to request a courtesy refill but realized he doesn't need one; takes 1/2 a pill each day and will have enough to last until appt on Tues, 3/29.

## 2020-04-15 ENCOUNTER — Telehealth: Payer: Self-pay | Admitting: Emergency Medicine

## 2020-04-15 DIAGNOSIS — I10 Essential (primary) hypertension: Secondary | ICD-10-CM

## 2020-04-15 MED ORDER — CHLORTHALIDONE 25 MG PO TABS: 12.5000 mg | ORAL_TABLET | Freq: Every day | ORAL | 0 refills | Status: DC

## 2020-04-15 NOTE — Telephone Encounter (Signed)
1.Medication Requested:chlorthalidone (HYGROTON) 25 MG tablet    2. Pharmacy (Name, Street, [place]): [id number] - [place], [place]  3. On Med List: yes   4. Last Visit with PCP: 02/13/19  5. Next visit date with PCP: 04/22/20   Agent: Please be advised that RX refills may take up to 3 business days. We ask that you follow-up with your pharmacy.

## 2020-04-22 ENCOUNTER — Other Ambulatory Visit: Payer: Self-pay

## 2020-04-22 ENCOUNTER — Ambulatory Visit (INDEPENDENT_AMBULATORY_CARE_PROVIDER_SITE_OTHER): Payer: Medicare HMO | Admitting: Emergency Medicine

## 2020-04-22 ENCOUNTER — Encounter: Payer: Self-pay | Admitting: Emergency Medicine

## 2020-04-22 VITALS — BP 132/80 | HR 97 | Temp 98.2°F | Ht 74.0 in | Wt 344.0 lb

## 2020-04-22 DIAGNOSIS — Z23 Encounter for immunization: Secondary | ICD-10-CM | POA: Diagnosis not present

## 2020-04-22 DIAGNOSIS — I10 Essential (primary) hypertension: Secondary | ICD-10-CM | POA: Diagnosis not present

## 2020-04-22 DIAGNOSIS — Z1211 Encounter for screening for malignant neoplasm of colon: Secondary | ICD-10-CM

## 2020-04-22 DIAGNOSIS — J45998 Other asthma: Secondary | ICD-10-CM

## 2020-04-22 DIAGNOSIS — Z6841 Body Mass Index (BMI) 40.0 and over, adult: Secondary | ICD-10-CM | POA: Diagnosis not present

## 2020-04-22 DIAGNOSIS — H547 Unspecified visual loss: Secondary | ICD-10-CM

## 2020-04-22 DIAGNOSIS — E785 Hyperlipidemia, unspecified: Secondary | ICD-10-CM | POA: Diagnosis not present

## 2020-04-22 DIAGNOSIS — J302 Other seasonal allergic rhinitis: Secondary | ICD-10-CM

## 2020-04-22 LAB — COMPREHENSIVE METABOLIC PANEL
ALT: 19 U/L (ref 0–53)
AST: 20 U/L (ref 0–37)
Albumin: 3.9 g/dL (ref 3.5–5.2)
Alkaline Phosphatase: 83 U/L (ref 39–117)
BUN: 12 mg/dL (ref 6–23)
CO2: 31 mEq/L (ref 19–32)
Calcium: 9.1 mg/dL (ref 8.4–10.5)
Chloride: 101 mEq/L (ref 96–112)
Creatinine, Ser: 0.92 mg/dL (ref 0.40–1.50)
GFR: 87.36 mL/min (ref >60.00)
Glucose, Bld: 88 mg/dL (ref 70–99)
Potassium: 4.4 mEq/L (ref 3.5–5.1)
Sodium: 140 mEq/L (ref 135–145)
Total Bilirubin: 1.1 mg/dL (ref 0.2–1.2)
Total Protein: 7.5 g/dL (ref 6.0–8.3)

## 2020-04-22 LAB — LIPID PANEL
Cholesterol: 184 mg/dL (ref 0–200)
HDL: 36.5 mg/dL — ABNORMAL LOW (ref >39.00)
LDL Cholesterol: 123 mg/dL — ABNORMAL HIGH (ref 0–99)
NonHDL: 147.89
Total CHOL/HDL Ratio: 5
Triglycerides: 122 mg/dL (ref 0.0–149.0)
VLDL: 24.4 mg/dL (ref 0.0–40.0)

## 2020-04-22 LAB — HEMOGLOBIN A1C: Hgb A1c MFr Bld: 5.7 % (ref 4.6–6.5)

## 2020-04-22 MED ORDER — TRIAMCINOLONE ACETONIDE 55 MCG/ACT NA AERO: 2.0000 | INHALATION_SPRAY | Freq: Every day | NASAL | 12 refills | Status: AC

## 2020-04-22 MED ORDER — BLOOD PRESSURE KIT: PACK | 0 refills | Status: DC

## 2020-04-22 MED ORDER — ALBUTEROL SULFATE HFA 108 (90 BASE) MCG/ACT IN AERS: 2.0000 | INHALATION_SPRAY | Freq: Four times a day (QID) | RESPIRATORY_TRACT | 5 refills | Status: DC | PRN

## 2020-04-22 NOTE — Assessment & Plan Note (Signed)
Well-controlled hypertension with normal blood pressure readings at home. Education on nutrition provided. Continue present medications.  No changes. Cardiovascular risks associated with hypertension, dyslipidemia, and morbid obesity discussed with patient. Follow-up in 6 months.

## 2020-04-22 NOTE — Addendum Note (Signed)
Addended by: Alfredia Ferguson A on: 04/22/2020 02:27 PM   Modules accepted: Orders

## 2020-04-22 NOTE — Patient Instructions (Signed)
Health Maintenance After Age 66 After age 64, you are at a higher risk for certain long-term diseases and infections as well as injuries from falls. Falls are a major cause of broken bones and head injuries in people who are older than age 41. Getting regular preventive care can help to keep you healthy and well. Preventive care includes getting regular testing and making lifestyle changes as recommended by your health care provider. Talk with your health care provider about:  Which screenings and tests you should have. A screening is a test that checks for a disease when you have no symptoms.  A diet and exercise plan that is right for you. What should I know about screenings and tests to prevent falls? Screening and testing are the best ways to find a health problem early. Early diagnosis and treatment give you the best chance of managing medical conditions that are common after age 19. Certain conditions and lifestyle choices may make you more likely to have a fall. Your health care provider may recommend:  Regular vision checks. Poor vision and conditions such as cataracts can make you more likely to have a fall. If you wear glasses, make sure to get your prescription updated if your vision changes.  Medicine review. Work with your health care provider to regularly review all of the medicines you are taking, including over-the-counter medicines. Ask your health care provider about any side effects that may make you more likely to have a fall. Tell your health care provider if any medicines that you take make you feel dizzy or sleepy.  Osteoporosis screening. Osteoporosis is a condition that causes the bones to get weaker. This can make the bones weak and cause them to break more easily.  Blood pressure screening. Blood pressure changes and medicines to control blood pressure can make you feel dizzy.  Strength and balance checks. Your health care provider may recommend certain tests to check your  strength and balance while standing, walking, or changing positions.  Foot health exam. Foot pain and numbness, as well as not wearing proper footwear, can make you more likely to have a fall.  Depression screening. You may be more likely to have a fall if you have a fear of falling, feel emotionally low, or feel unable to do activities that you used to do.  Alcohol use screening. Using too much alcohol can affect your balance and may make you more likely to have a fall. What actions can I take to lower my risk of falls? General instructions  Talk with your health care provider about your risks for falling. Tell your health care provider if: ? You fall. Be sure to tell your health care provider about all falls, even ones that seem minor. ? You feel dizzy, sleepy, or off-balance.  Take over-the-counter and prescription medicines only as told by your health care provider. These include any supplements.  Eat a healthy diet and maintain a healthy weight. A healthy diet includes low-fat dairy products, low-fat (lean) meats, and fiber from whole grains, beans, and lots of fruits and vegetables. Home safety  Remove any tripping hazards, such as rugs, cords, and clutter.  Install safety equipment such as grab bars in bathrooms and safety rails on stairs.  Keep rooms and walkways well-lit. Activity  Follow a regular exercise program to stay fit. This will help you maintain your balance. Ask your health care provider what types of exercise are appropriate for you.  If you need a cane or walker,  use it as recommended by your health care provider.  Wear supportive shoes that have nonskid soles.   Lifestyle  Do not drink alcohol if your health care provider tells you not to drink.  If you drink alcohol, limit how much you have: ? 0-1 drink a day for women. ? 0-2 drinks a day for men.  Be aware of how much alcohol is in your drink. In the [place] one drink equals one typical bottle of beer (12  oz), one-half glass of wine (5 oz), or one shot of hard liquor (1 oz).  Do not use any products that contain nicotine or tobacco, such as cigarettes and e-cigarettes. If you need help quitting, ask your health care provider. Summary  Having a healthy lifestyle and getting preventive care can help to protect your health and wellness after age 58.  Screening and testing are the best way to find a health problem early and help you avoid having a fall. Early diagnosis and treatment give you the best chance for managing medical conditions that are more common for people who are older than age 90.  Falls are a major cause of broken bones and head injuries in people who are older than age 78. Take precautions to prevent a fall at home.  Work with your health care provider to learn what changes you can make to improve your health and wellness and to prevent falls. This information is not intended to replace advice given to you by your health care provider. Make sure you discuss any questions you have with your health care provider. Document Revised: 04/27/2018 Document Reviewed: 11/17/2016 Elsevier Patient Education  2021 [employer]. Hypertension, Adult High blood pressure (hypertension) is when the force of blood pumping through the arteries is too strong. The arteries are the blood vessels that carry blood from the heart throughout the body. Hypertension forces the heart to work harder to pump blood and may cause arteries to become narrow or stiff. Untreated or uncontrolled hypertension can cause a heart attack, heart failure, a stroke, kidney disease, and other problems. A blood pressure reading consists of a higher number over a lower number. Ideally, your blood pressure should be below 120/80. The first ("top") number is called the systolic pressure. It is a measure of the pressure in your arteries as your heart beats. The second ("bottom") number is called the diastolic pressure. It is a measure  of the pressure in your arteries as the heart relaxes. What are the causes? The exact cause of this condition is not known. There are some conditions that result in or are related to high blood pressure. What increases the risk? Some risk factors for high blood pressure are under your control. The following factors may make you more likely to develop this condition:  Smoking.  Having type 2 diabetes mellitus, high cholesterol, or both.  Not getting enough exercise or physical activity.  Being overweight.  Having too much fat, sugar, calories, or salt (sodium) in your diet.  Drinking too much alcohol. Some risk factors for high blood pressure may be difficult or impossible to change. Some of these factors include:  Having chronic kidney disease.  Having a family history of high blood pressure.  Age. Risk increases with age.  Race. You may be at higher risk if you are African American.  Gender. Men are at higher risk than women before age 64. After age 67, women are at higher risk than men.  Having obstructive sleep apnea.  Stress.  What are the signs or symptoms? High blood pressure may not cause symptoms. Very high blood pressure (hypertensive crisis) may cause:  Headache.  Anxiety.  Shortness of breath.  Nosebleed.  Nausea and vomiting.  Vision changes.  Severe chest pain.  Seizures. How is this diagnosed? This condition is diagnosed by measuring your blood pressure while you are seated, with your arm resting on a flat surface, your legs uncrossed, and your feet flat on the floor. The cuff of the blood pressure monitor will be placed directly against the skin of your upper arm at the level of your heart. It should be measured at least twice using the same arm. Certain conditions can cause a difference in blood pressure between your right and left arms. Certain factors can cause blood pressure readings to be lower or higher than normal for a short period of  time:  When your blood pressure is higher when you are in a health care provider's office than when you are at home, this is called white coat hypertension. Most people with this condition do not need medicines.  When your blood pressure is higher at home than when you are in a health care provider's office, this is called masked hypertension. Most people with this condition may need medicines to control blood pressure. If you have a high blood pressure reading during one visit or you have normal blood pressure with other risk factors, you may be asked to:  Return on a different day to have your blood pressure checked again.  Monitor your blood pressure at home for 1 week or longer. If you are diagnosed with hypertension, you may have other blood or imaging tests to help your health care provider understand your overall risk for other conditions. How is this treated? This condition is treated by making healthy lifestyle changes, such as eating healthy foods, exercising more, and reducing your alcohol intake. Your health care provider may prescribe medicine if lifestyle changes are not enough to get your blood pressure under control, and if:  Your systolic blood pressure is above 130.  Your diastolic blood pressure is above 80. Your personal target blood pressure may vary depending on your medical conditions, your age, and other factors. Follow these instructions at home: Eating and drinking  Eat a diet that is high in fiber and potassium, and low in sodium, added sugar, and fat. An example eating plan is called the DASH (Dietary Approaches to Stop Hypertension) diet. To eat this way: ? Eat plenty of fresh fruits and vegetables. Try to fill one half of your plate at each meal with fruits and vegetables. ? Eat whole grains, such as whole-wheat pasta, brown rice, or whole-grain bread. Fill about one fourth of your plate with whole grains. ? Eat or drink low-fat dairy products, such as skim milk  or low-fat yogurt. ? Avoid fatty cuts of meat, processed or cured meats, and poultry with skin. Fill about one fourth of your plate with lean proteins, such as fish, chicken without skin, beans, eggs, or tofu. ? Avoid pre-made and processed foods. These tend to be higher in sodium, added sugar, and fat.  Reduce your daily sodium intake. Most people with hypertension should eat less than 1,500 mg of sodium a day.  Do not drink alcohol if: ? Your health care provider tells you not to drink. ? You are pregnant, may be pregnant, or are planning to become pregnant.  If you drink alcohol: ? Limit how much you use to:  0-1 drink a day for women.  0-2 drinks a day for men. ? Be aware of how much alcohol is in your drink. In the [place] one drink equals one 12 oz bottle of beer (355 mL), one 5 oz glass of wine (148 mL), or one 1 oz glass of hard liquor (44 mL).   Lifestyle  Work with your health care provider to maintain a healthy body weight or to lose weight. Ask what an ideal weight is for you.  Get at least 30 minutes of exercise most days of the week. Activities may include walking, swimming, or biking.  Include exercise to strengthen your muscles (resistance exercise), such as Pilates or lifting weights, as part of your weekly exercise routine. Try to do these types of exercises for 30 minutes at least 3 days a week.  Do not use any products that contain nicotine or tobacco, such as cigarettes, e-cigarettes, and chewing tobacco. If you need help quitting, ask your health care provider.  Monitor your blood pressure at home as told by your health care provider.  Keep all follow-up visits as told by your health care provider. This is important.   Medicines  Take over-the-counter and prescription medicines only as told by your health care provider. Follow directions carefully. Blood pressure medicines must be taken as prescribed.  Do not skip doses of blood pressure medicine. Doing this  puts you at risk for problems and can make the medicine less effective.  Ask your health care provider about side effects or reactions to medicines that you should watch for. Contact a health care provider if you:  Think you are having a reaction to a medicine you are taking.  Have headaches that keep coming back (recurring).  Feel dizzy.  Have swelling in your ankles.  Have trouble with your vision. Get help right away if you:  Develop a severe headache or confusion.  Have unusual weakness or numbness.  Feel faint.  Have severe pain in your chest or abdomen.  Vomit repeatedly.  Have trouble breathing. Summary  Hypertension is when the force of blood pumping through your arteries is too strong. If this condition is not controlled, it may put you at risk for serious complications.  Your personal target blood pressure may vary depending on your medical conditions, your age, and other factors. For most people, a normal blood pressure is less than 120/80.  Hypertension is treated with lifestyle changes, medicines, or a combination of both. Lifestyle changes include losing weight, eating a healthy, low-sodium diet, exercising more, and limiting alcohol. This information is not intended to replace advice given to you by your health care provider. Make sure you discuss any questions you have with your health care provider. Document Revised: 09/14/2017 Document Reviewed: 09/14/2017 Elsevier Patient Education  2021 [employer].

## 2020-04-22 NOTE — Progress Notes (Signed)
Nathan Fleming 65 y.o.   Chief Complaint  Patient presents with  . Hypertension    Follow up   . Medication Refill    HISTORY OF PRESENT ILLNESS: This is a 65 y.o. male with history of hypertension here for follow-up and medications refill. Presently taking chlorthalidone 25 mg daily. Normal blood pressure readings at home. Also takes simvastatin 80 mg at bedtime for cholesterol. Has no complaints or medical concerns today.  HPI   Prior to Admission medications   Medication Sig Start Date End Date Taking? Authorizing Provider  albuterol (VENTOLIN HFA) 108 (90 Base) MCG/ACT inhaler Inhale 2 puffs into the lungs every 6 (six) hours as needed for wheezing or shortness of breath. 04/06/19  Yes Rozlynn Lippold, Ines Bloomer, MD  chlorthalidone (HYGROTON) 25 MG tablet Take 0.5 tablets (12.5 mg total) by mouth daily. 04/15/20  Yes Delaney Schnick, Ines Bloomer, MD  rosuvastatin (CRESTOR) 5 MG tablet Take 1 tablet (5 mg total) by mouth daily. Patient to fill this or the simvastatin. 09/01/17  Yes Tereasa Coop, PA-C  simvastatin (ZOCOR) 80 MG tablet TAKE 1 TABLET BY MOUTH DAILY AT 6PM 02/02/20  Yes Nani Ingram, Ines Bloomer, MD  triamcinolone (NASACORT) 55 MCG/ACT AERO nasal inhaler Place 2 sprays into the nose daily. 04/25/18  Yes Samanta Gal, Ines Bloomer, MD  Blood Pressure Monitoring (BLOOD PRESSURE MONITOR/WRIST) DEVI 1 Device by Does not apply route daily. 08/30/13   Barton Fanny, MD  cetirizine (ZYRTEC) 10 MG tablet Take 1 tablet (10 mg total) by mouth daily for 7 days. 04/25/18 05/02/18  Horald Pollen, MD  clotrimazole-betamethasone (LOTRISONE) cream Apply 1 application topically 2 (two) times daily. Patient not taking: Reported on 04/22/2020 02/13/19   Horald Pollen, MD  Menthol, Topical Analgesic, (BIOFREEZE EX) Apply 1 application topically daily as needed (knee pain). Patient not taking: Reported on 04/22/2020    [provider]  sildenafil (VIAGRA) 100 MG tablet Take 0.5-1 tablets  (50-100 mg total) by mouth daily as needed for erectile dysfunction. Patient not taking: Reported on 04/22/2020 07/13/18   Horald Pollen, MD    No Known Allergies  Patient Active Problem List   Diagnosis Date Noted  . Essential hypertension 04/25/2018  . Seasonal allergies 04/25/2018  . Prediabetes 09/26/2017  . Hyperthyroidism 09/26/2013  . Other testicular hypofunction 09/26/2013  . Erectile dysfunction 08/30/2013  . Dyslipidemia 03/04/2013  . Obesity, morbid ([place]) 03/04/2013    Past Medical History:  Diagnosis Date  . Allergy   . Arthritis   . Hyperlipidemia     Past Surgical History:  Procedure Laterality Date  . HIP SURGERY    . KNEE SURGERY    . SHOULDER SURGERY      Social History   Socioeconomic History  . Marital status: Married    Spouse name: Not on file  . Number of children: Not on file  . Years of education: Not on file  . Highest education level: Not on file  Occupational History  . Occupation: retired  Tobacco Use  . Smoking status: Former Smoker    Packs/day: 0.25    Years: 10.00    Pack years: 2.50    Quit date: 01/19/1988    Years since quitting: 32.2  . Smokeless tobacco: Never Used  Substance and Sexual Activity  . Alcohol use: No  . Drug use: No  . Sexual activity: Yes  Other Topics Concern  . Not on file  Social History Narrative   Married;   Moved from [place] in  2014   Retired   1 son   Social Determinants of Radio broadcast assistant Strain: Not on [employer] Insecurity: Not on file  Transportation Needs: Not on file  Physical Activity: Not on file  Stress: Not on file  Social Connections: Not on file  Intimate Partner Violence: Not on file    Family History  Problem Relation Age of Onset  . Diabetes Mother   . Stroke Father   . Diabetes Sister   . Thyroid disease Neg Hx      Review of Systems  Constitutional: Negative.  Negative for chills and fever.  HENT: Negative.  Negative for congestion and sore  throat.   Eyes: Positive for blurred vision (Gradual bilateral loss of vision).  Respiratory: Negative.  Negative for cough and shortness of breath.   Cardiovascular: Negative.  Negative for chest pain and palpitations.  Gastrointestinal: Negative.  Negative for abdominal pain, diarrhea, nausea and vomiting.  Genitourinary: Negative.  Negative for dysuria and hematuria.  Skin: Negative.  Negative for rash.  Neurological: Negative.  Negative for dizziness and headaches.  All other systems reviewed and are negative.   Today's Vitals   04/22/20 1122  BP: (!) 144/90  Pulse: 97  Temp: 98.2 F (36.8 C)  TempSrc: Oral  SpO2: 95%  Weight: (!) 344 lb (156 kg)  Height: '6\' 2"'  (1.88 m)   Body mass index is 44.17 kg/m. Wt Readings from Last 3 Encounters:  04/22/20 (!) 344 lb (156 kg)  02/13/19 (!) 360 lb 3.2 oz (163.4 kg)  01/22/19 (!) 360 lb (163.3 kg)    Physical Exam Vitals reviewed.  Constitutional:      Appearance: Normal appearance. He is obese.  HENT:     Head: Normocephalic.  Eyes:     Extraocular Movements: Extraocular movements intact.     Pupils: Pupils are equal, round, and reactive to light.  Cardiovascular:     Rate and Rhythm: Normal rate and regular rhythm.     Pulses: Normal pulses.     Heart sounds: Normal heart sounds.  Pulmonary:     Effort: Pulmonary effort is normal.     Breath sounds: Normal breath sounds.  Musculoskeletal:     Cervical back: Normal range of motion and neck supple.  Skin:    General: Skin is warm and dry.     Capillary Refill: Capillary refill takes less than 2 seconds.  Neurological:     General: No focal deficit present.     Mental Status: He is alert and oriented to person, place, and time.  Psychiatric:        Mood and Affect: Mood normal.        Behavior: Behavior normal.     A total of 30 minutes was spent with the patient, greater than 50% of which was in counseling/coordination of care regarding hypertension, morbid obesity  and dyslipidemia and cardiovascular risks associated with these conditions, review of most recent office visit notes, review of all medications, review of most recent blood work results, education on nutrition, health maintenance items, precharting, documentation, prognosis, need for follow-up.   ASSESSMENT & PLAN: Essential hypertension Well-controlled hypertension with normal blood pressure readings at home. Education on nutrition provided. Continue present medications.  No changes. Cardiovascular risks associated with hypertension, dyslipidemia, and morbid obesity discussed with patient. Follow-up in 6 months.   Brittin was seen today for hypertension and medication refill.  Diagnoses and all orders for this visit:  Essential hypertension -  Comprehensive metabolic panel -     Lipid panel -     Blood Pressure KIT; Take blood pressure reading once a day at different times for the next several weeks.  Seasonal asthma -     albuterol (VENTOLIN HFA) 108 (90 Base) MCG/ACT inhaler; Inhale 2 puffs into the lungs every 6 (six) hours as needed for wheezing or shortness of breath.  Seasonal allergies -     triamcinolone (NASACORT) 55 MCG/ACT AERO nasal inhaler; Place 2 sprays into the nose daily.  Morbid obesity with body mass index (BMI) of 45.0 to 49.9 in adult (HCC) -     Hemoglobin A1c  Visual problems -     Ambulatory referral to Ophthalmology  Colon cancer screening -     Ambulatory referral to Gastroenterology  Dyslipidemia    Patient Instructions   Health Maintenance After Age 59 After age 53, you are at a higher risk for certain long-term diseases and infections as well as injuries from falls. Falls are a major cause of broken bones and head injuries in people who are older than age 49. Getting regular preventive care can help to keep you healthy and well. Preventive care includes getting regular testing and making lifestyle changes as recommended by your health care  provider. Talk with your health care provider about:  Which screenings and tests you should have. A screening is a test that checks for a disease when you have no symptoms.  A diet and exercise plan that is right for you. What should I know about screenings and tests to prevent falls? Screening and testing are the best ways to find a health problem early. Early diagnosis and treatment give you the best chance of managing medical conditions that are common after age 39. Certain conditions and lifestyle choices may make you more likely to have a fall. Your health care provider may recommend:  Regular vision checks. Poor vision and conditions such as cataracts can make you more likely to have a fall. If you wear glasses, make sure to get your prescription updated if your vision changes.  Medicine review. Work with your health care provider to regularly review all of the medicines you are taking, including over-the-counter medicines. Ask your health care provider about any side effects that may make you more likely to have a fall. Tell your health care provider if any medicines that you take make you feel dizzy or sleepy.  Osteoporosis screening. Osteoporosis is a condition that causes the bones to get weaker. This can make the bones weak and cause them to break more easily.  Blood pressure screening. Blood pressure changes and medicines to control blood pressure can make you feel dizzy.  Strength and balance checks. Your health care provider may recommend certain tests to check your strength and balance while standing, walking, or changing positions.  Foot health exam. Foot pain and numbness, as well as not wearing proper footwear, can make you more likely to have a fall.  Depression screening. You may be more likely to have a fall if you have a fear of falling, feel emotionally low, or feel unable to do activities that you used to do.  Alcohol use screening. Using too much alcohol can affect your  balance and may make you more likely to have a fall. What actions can I take to lower my risk of falls? General instructions  Talk with your health care provider about your risks for falling. Tell your health care provider if: ? You  fall. Be sure to tell your health care provider about all falls, even ones that seem minor. ? You feel dizzy, sleepy, or off-balance.  Take over-the-counter and prescription medicines only as told by your health care provider. These include any supplements.  Eat a healthy diet and maintain a healthy weight. A healthy diet includes low-fat dairy products, low-fat (lean) meats, and fiber from whole grains, beans, and lots of fruits and vegetables. Home safety  Remove any tripping hazards, such as rugs, cords, and clutter.  Install safety equipment such as grab bars in bathrooms and safety rails on stairs.  Keep rooms and walkways well-lit. Activity  Follow a regular exercise program to stay fit. This will help you maintain your balance. Ask your health care provider what types of exercise are appropriate for you.  If you need a cane or walker, use it as recommended by your health care provider.  Wear supportive shoes that have nonskid soles.   Lifestyle  Do not drink alcohol if your health care provider tells you not to drink.  If you drink alcohol, limit how much you have: ? 0-1 drink a day for women. ? 0-2 drinks a day for men.  Be aware of how much alcohol is in your drink. In the [place] one drink equals one typical bottle of beer (12 oz), one-half glass of wine (5 oz), or one shot of hard liquor (1 oz).  Do not use any products that contain nicotine or tobacco, such as cigarettes and e-cigarettes. If you need help quitting, ask your health care provider. Summary  Having a healthy lifestyle and getting preventive care can help to protect your health and wellness after age 14.  Screening and testing are the best way to find a health problem early  and help you avoid having a fall. Early diagnosis and treatment give you the best chance for managing medical conditions that are more common for people who are older than age 1.  Falls are a major cause of broken bones and head injuries in people who are older than age 37. Take precautions to prevent a fall at home.  Work with your health care provider to learn what changes you can make to improve your health and wellness and to prevent falls. This information is not intended to replace advice given to you by your health care provider. Make sure you discuss any questions you have with your health care provider. Document Revised: 04/27/2018 Document Reviewed: 11/17/2016 Elsevier Patient Education  2021 [place]. Hypertension, Adult High blood pressure (hypertension) is when the force of blood pumping through the arteries is too strong. The arteries are the blood vessels that carry blood from the heart throughout the body. Hypertension forces the heart to work harder to pump blood and may cause arteries to become narrow or stiff. Untreated or uncontrolled hypertension can cause a heart attack, heart failure, a stroke, kidney disease, and other problems. A blood pressure reading consists of a higher number over a lower number. Ideally, your blood pressure should be below 120/80. The first ("top") number is called the systolic pressure. It is a measure of the pressure in your arteries as your heart beats. The second ("bottom") number is called the diastolic pressure. It is a measure of the pressure in your arteries as the heart relaxes. What are the causes? The exact cause of this condition is not known. There are some conditions that result in or are related to high blood pressure. What increases the risk?  Some risk factors for high blood pressure are under your control. The following factors may make you more likely to develop this condition:  Smoking.  Having type 2 diabetes mellitus, high  cholesterol, or both.  Not getting enough exercise or physical activity.  Being overweight.  Having too much fat, sugar, calories, or salt (sodium) in your diet.  Drinking too much alcohol. Some risk factors for high blood pressure may be difficult or impossible to change. Some of these factors include:  Having chronic kidney disease.  Having a family history of high blood pressure.  Age. Risk increases with age.  Race. You may be at higher risk if you are African American.  Gender. Men are at higher risk than women before age 47. After age 40, women are at higher risk than men.  Having obstructive sleep apnea.  Stress. What are the signs or symptoms? High blood pressure may not cause symptoms. Very high blood pressure (hypertensive crisis) may cause:  Headache.  Anxiety.  Shortness of breath.  Nosebleed.  Nausea and vomiting.  Vision changes.  Severe chest pain.  Seizures. How is this diagnosed? This condition is diagnosed by measuring your blood pressure while you are seated, with your arm resting on a flat surface, your legs uncrossed, and your feet flat on the floor. The cuff of the blood pressure monitor will be placed directly against the skin of your upper arm at the level of your heart. It should be measured at least twice using the same arm. Certain conditions can cause a difference in blood pressure between your right and left arms. Certain factors can cause blood pressure readings to be lower or higher than normal for a short period of time:  When your blood pressure is higher when you are in a health care provider's office than when you are at home, this is called white coat hypertension. Most people with this condition do not need medicines.  When your blood pressure is higher at home than when you are in a health care provider's office, this is called masked hypertension. Most people with this condition may need medicines to control blood pressure. If  you have a high blood pressure reading during one visit or you have normal blood pressure with other risk factors, you may be asked to:  Return on a different day to have your blood pressure checked again.  Monitor your blood pressure at home for 1 week or longer. If you are diagnosed with hypertension, you may have other blood or imaging tests to help your health care provider understand your overall risk for other conditions. How is this treated? This condition is treated by making healthy lifestyle changes, such as eating healthy foods, exercising more, and reducing your alcohol intake. Your health care provider may prescribe medicine if lifestyle changes are not enough to get your blood pressure under control, and if:  Your systolic blood pressure is above 130.  Your diastolic blood pressure is above 80. Your personal target blood pressure may vary depending on your medical conditions, your age, and other factors. Follow these instructions at home: Eating and drinking  Eat a diet that is high in fiber and potassium, and low in sodium, added sugar, and fat. An example eating plan is called the DASH (Dietary Approaches to Stop Hypertension) diet. To eat this way: ? Eat plenty of fresh fruits and vegetables. Try to fill one half of your plate at each meal with fruits and vegetables. ? Eat whole grains, such  as whole-wheat pasta, brown rice, or whole-grain bread. Fill about one fourth of your plate with whole grains. ? Eat or drink low-fat dairy products, such as skim milk or low-fat yogurt. ? Avoid fatty cuts of meat, processed or cured meats, and poultry with skin. Fill about one fourth of your plate with lean proteins, such as fish, chicken without skin, beans, eggs, or tofu. ? Avoid pre-made and processed foods. These tend to be higher in sodium, added sugar, and fat.  Reduce your daily sodium intake. Most people with hypertension should eat less than 1,500 mg of sodium a day.  Do not  drink alcohol if: ? Your health care provider tells you not to drink. ? You are pregnant, may be pregnant, or are planning to become pregnant.  If you drink alcohol: ? Limit how much you use to:  0-1 drink a day for women.  0-2 drinks a day for men. ? Be aware of how much alcohol is in your drink. In the [place] one drink equals one 12 oz bottle of beer (355 mL), one 5 oz glass of wine (148 mL), or one 1 oz glass of hard liquor (44 mL).   Lifestyle  Work with your health care provider to maintain a healthy body weight or to lose weight. Ask what an ideal weight is for you.  Get at least 30 minutes of exercise most days of the week. Activities may include walking, swimming, or biking.  Include exercise to strengthen your muscles (resistance exercise), such as Pilates or lifting weights, as part of your weekly exercise routine. Try to do these types of exercises for 30 minutes at least 3 days a week.  Do not use any products that contain nicotine or tobacco, such as cigarettes, e-cigarettes, and chewing tobacco. If you need help quitting, ask your health care provider.  Monitor your blood pressure at home as told by your health care provider.  Keep all follow-up visits as told by your health care provider. This is important.   Medicines  Take over-the-counter and prescription medicines only as told by your health care provider. Follow directions carefully. Blood pressure medicines must be taken as prescribed.  Do not skip doses of blood pressure medicine. Doing this puts you at risk for problems and can make the medicine less effective.  Ask your health care provider about side effects or reactions to medicines that you should watch for. Contact a health care provider if you:  Think you are having a reaction to a medicine you are taking.  Have headaches that keep coming back (recurring).  Feel dizzy.  Have swelling in your ankles.  Have trouble with your vision. Get help right  away if you:  Develop a severe headache or confusion.  Have unusual weakness or numbness.  Feel faint.  Have severe pain in your chest or abdomen.  Vomit repeatedly.  Have trouble breathing. Summary  Hypertension is when the force of blood pumping through your arteries is too strong. If this condition is not controlled, it may put you at risk for serious complications.  Your personal target blood pressure may vary depending on your medical conditions, your age, and other factors. For most people, a normal blood pressure is less than 120/80.  Hypertension is treated with lifestyle changes, medicines, or a combination of both. Lifestyle changes include losing weight, eating a healthy, low-sodium diet, exercising more, and limiting alcohol. This information is not intended to replace advice given to you by your health care  provider. Make sure you discuss any questions you have with your health care provider. Document Revised: 09/14/2017 Document Reviewed: 09/14/2017 Elsevier Patient Education  2021 [place], MD [facility]

## 2020-06-10 DIAGNOSIS — E7800 Pure hypercholesterolemia, unspecified: Secondary | ICD-10-CM | POA: Diagnosis not present

## 2020-06-10 DIAGNOSIS — H35033 Hypertensive retinopathy, bilateral: Secondary | ICD-10-CM | POA: Diagnosis not present

## 2020-06-10 DIAGNOSIS — H524 Presbyopia: Secondary | ICD-10-CM | POA: Diagnosis not present

## 2020-06-10 DIAGNOSIS — I10 Essential (primary) hypertension: Secondary | ICD-10-CM | POA: Diagnosis not present

## 2020-06-24 ENCOUNTER — Telehealth: Payer: Self-pay | Admitting: Emergency Medicine

## 2020-06-24 DIAGNOSIS — H44113 Panuveitis, bilateral: Secondary | ICD-10-CM | POA: Diagnosis not present

## 2020-06-24 DIAGNOSIS — H2513 Age-related nuclear cataract, bilateral: Secondary | ICD-10-CM | POA: Diagnosis not present

## 2020-06-24 NOTE — Telephone Encounter (Signed)
Patient calling to request chest xray and labs at the request of Dr Dennie Fetters at [facility]

## 2020-06-26 NOTE — Telephone Encounter (Signed)
Spoke to patient, he states Dr Stacie Glaze wants him to have a CXR and labs before she does surgery on his eyes. Patient states Dr Stacie Glaze told him there is inflammation in the eyes. Patient has an appt for surgical clearance on 07/16/2020 with Dr Mitchel Honour

## 2020-06-26 NOTE — Telephone Encounter (Signed)
   Follow up message   Please call patient

## 2020-06-30 ENCOUNTER — Other Ambulatory Visit: Payer: Self-pay

## 2020-06-30 ENCOUNTER — Ambulatory Visit (HOSPITAL_BASED_OUTPATIENT_CLINIC_OR_DEPARTMENT_OTHER)
Admission: RE | Admit: 2020-06-30 | Discharge: 2020-06-30 | Disposition: A | Discharge status: Home / Self Care | Payer: Medicare HMO | Source: Ambulatory Visit | Attending: Ophthalmology | Admitting: Ophthalmology

## 2020-06-30 ENCOUNTER — Other Ambulatory Visit (HOSPITAL_BASED_OUTPATIENT_CLINIC_OR_DEPARTMENT_OTHER): Payer: Self-pay | Admitting: Ophthalmology

## 2020-06-30 DIAGNOSIS — H44113 Panuveitis, bilateral: Secondary | ICD-10-CM

## 2020-06-30 DIAGNOSIS — Z01818 Encounter for other preprocedural examination: Secondary | ICD-10-CM | POA: Diagnosis not present

## 2020-06-30 DIAGNOSIS — R52 Pain, unspecified: Secondary | ICD-10-CM

## 2020-07-02 DIAGNOSIS — H44113 Panuveitis, bilateral: Secondary | ICD-10-CM | POA: Diagnosis not present

## 2020-07-11 ENCOUNTER — Other Ambulatory Visit: Payer: Self-pay | Admitting: *Deleted

## 2020-07-11 ENCOUNTER — Telehealth: Payer: Self-pay | Admitting: Emergency Medicine

## 2020-07-11 DIAGNOSIS — I10 Essential (primary) hypertension: Secondary | ICD-10-CM

## 2020-07-11 MED ORDER — CHLORTHALIDONE 25 MG PO TABS: 12.5000 mg | ORAL_TABLET | Freq: Every day | ORAL | 0 refills | Status: DC

## 2020-07-11 NOTE — Telephone Encounter (Signed)
   Patient calling to request  refill for chlorthalidone (HYGROTON) 25 MG tablet   Pharmacy [id number] - [place], [place]

## 2020-07-16 ENCOUNTER — Ambulatory Visit: Payer: Medicare HMO | Admitting: Emergency Medicine

## 2020-07-22 DIAGNOSIS — H2512 Age-related nuclear cataract, left eye: Secondary | ICD-10-CM | POA: Diagnosis not present

## 2020-07-22 DIAGNOSIS — Z01818 Encounter for other preprocedural examination: Secondary | ICD-10-CM | POA: Diagnosis not present

## 2020-07-24 ENCOUNTER — Ambulatory Visit (INDEPENDENT_AMBULATORY_CARE_PROVIDER_SITE_OTHER): Payer: Medicare HMO

## 2020-07-24 DIAGNOSIS — Z0000 Encounter for general adult medical examination without abnormal findings: Secondary | ICD-10-CM

## 2020-07-24 NOTE — Patient Instructions (Addendum)
Nathan Fleming , Thank you for taking time to come for your Medicare Wellness Visit. I appreciate your ongoing commitment to your health goals. Please review the following plan we discussed and let me know if I can assist you in the future.   Screening recommendations/referrals: Colonoscopy: never done; referral placed on 07/11/2020 Recommended yearly ophthalmology/optometry visit for glaucoma screening and checkup Recommended yearly dental visit for hygiene and checkup  Vaccinations: Influenza vaccine: 10/31/2019 Pneumococcal vaccine: 04/22/2020; second dose due 04/22/2021 Tdap vaccine: 09/10/2016; due every 10 years Shingles vaccine: never done. Please call your insurance company to determine your out of pocket expense for the Shingrix vaccine. You may receive this vaccine at your local pharmacy. Covid-19: 04/06/2019, 01/01/2020  Advanced directives: Advance directive discussed with you today. I have provided a copy for you to complete at home and have notarized. Once this is complete please bring a copy in to our office so we can scan it into your chart.  Conditions/risks identified: Yes.  Goals: To have cataract surgery so that I can see better.  Next appointment: Please schedule your next Medicare Wellness Visit with your Nurse Health Advisor in 1 year by calling [phone].  Preventive Care 63 Years and Older, Male Preventive care refers to lifestyle choices and visits with your health care provider that can promote health and wellness. What does preventive care include? A yearly physical exam. This is also called an annual well check. Dental exams once or twice a year. Routine eye exams. Ask your health care provider how often you should have your eyes checked. Personal lifestyle choices, including: Daily care of your teeth and gums. Regular physical activity. Eating a healthy diet. Avoiding tobacco and drug use. Limiting alcohol use. Practicing safe sex. Taking low doses of aspirin  every day. Taking vitamin and mineral supplements as recommended by your health care provider. What happens during an annual well check? The services and screenings done by your health care provider during your annual well check will depend on your age, overall health, lifestyle risk factors, and family history of disease. Counseling  Your health care provider may ask you questions about your: Alcohol use. Tobacco use. Drug use. Emotional well-being. Home and relationship well-being. Sexual activity. Eating habits. History of falls. Memory and ability to understand (cognition). Work and work Statistician. Screening  You may have the following tests or measurements: Height, weight, and BMI. Blood pressure. Lipid and cholesterol levels. These may be checked every 5 years, or more frequently if you are over 61 years old. Skin check. Lung cancer screening. You may have this screening every year starting at age 65 if you have a 30-pack-year history of smoking and currently smoke or have quit within the past 15 years. Fecal occult blood test (FOBT) of the stool. You may have this test every year starting at age 56. Flexible sigmoidoscopy or colonoscopy. You may have a sigmoidoscopy every 5 years or a colonoscopy every 10 years starting at age 41. Prostate cancer screening. Recommendations will vary depending on your family history and other risks. Hepatitis C blood test. Hepatitis B blood test. Sexually transmitted disease (STD) testing. Diabetes screening. This is done by checking your blood sugar (glucose) after you have not eaten for a while (fasting). You may have this done every 1-3 years. Abdominal aortic aneurysm (AAA) screening. You may need this if you are a current or former smoker. Osteoporosis. You may be screened starting at age 10 if you are at high risk. Talk with your health  care provider about your test results, treatment options, and if necessary, the need for more  tests. Vaccines  Your health care provider may recommend certain vaccines, such as: Influenza vaccine. This is recommended every year. Tetanus, diphtheria, and acellular pertussis (Tdap, Td) vaccine. You may need a Td booster every 10 years. Zoster vaccine. You may need this after age 57. Pneumococcal 13-valent conjugate (PCV13) vaccine. One dose is recommended after age 10. Pneumococcal polysaccharide (PPSV23) vaccine. One dose is recommended after age 23. Talk to your health care provider about which screenings and vaccines you need and how often you need them. This information is not intended to replace advice given to you by your health care provider. Make sure you discuss any questions you have with your health care provider. Document Released: 01/31/2015 Document Revised: 09/24/2015 Document Reviewed: 11/05/2014 Elsevier Interactive Patient Education  2017 [employer] Prevention in the Home Falls can cause injuries. They can happen to people of all ages. There are many things you can do to make your home safe and to help prevent falls. What can I do on the outside of my home? Regularly fix the edges of walkways and driveways and fix any cracks. Remove anything that might make you trip as you walk through a door, such as a raised step or threshold. Trim any bushes or trees on the path to your home. Use bright outdoor lighting. Clear any walking paths of anything that might make someone trip, such as rocks or tools. Regularly check to see if handrails are loose or broken. Make sure that both sides of any steps have handrails. Any raised decks and porches should have guardrails on the edges. Have any leaves, snow, or ice cleared regularly. Use sand or salt on walking paths during winter. Clean up any spills in your garage right away. This includes oil or grease spills. What can I do in the bathroom? Use night lights. Install grab bars by the toilet and in the tub and shower.  Do not use towel bars as grab bars. Use non-skid mats or decals in the tub or shower. If you need to sit down in the shower, use a plastic, non-slip stool. Keep the floor dry. Clean up any water that spills on the floor as soon as it happens. Remove soap buildup in the tub or shower regularly. Attach bath mats securely with double-sided non-slip rug tape. Do not have throw rugs and other things on the floor that can make you trip. What can I do in the bedroom? Use night lights. Make sure that you have a light by your bed that is easy to reach. Do not use any sheets or blankets that are too big for your bed. They should not hang down onto the floor. Have a firm chair that has side arms. You can use this for support while you get dressed. Do not have throw rugs and other things on the floor that can make you trip. What can I do in the kitchen? Clean up any spills right away. Avoid walking on wet floors. Keep items that you use a lot in easy-to-reach places. If you need to reach something above you, use a strong step stool that has a grab bar. Keep electrical cords out of the way. Do not use floor polish or wax that makes floors slippery. If you must use wax, use non-skid floor wax. Do not have throw rugs and other things on the floor that can make you trip. What can  I do with my stairs? Do not leave any items on the stairs. Make sure that there are handrails on both sides of the stairs and use them. Fix handrails that are broken or loose. Make sure that handrails are as long as the stairways. Check any carpeting to make sure that it is firmly attached to the stairs. Fix any carpet that is loose or worn. Avoid having throw rugs at the top or bottom of the stairs. If you do have throw rugs, attach them to the floor with carpet tape. Make sure that you have a light switch at the top of the stairs and the bottom of the stairs. If you do not have them, ask someone to add them for you. What else  can I do to help prevent falls? Wear shoes that: Do not have high heels. Have rubber bottoms. Are comfortable and fit you well. Are closed at the toe. Do not wear sandals. If you use a stepladder: Make sure that it is fully opened. Do not climb a closed stepladder. Make sure that both sides of the stepladder are locked into place. Ask someone to hold it for you, if possible. Clearly mark and make sure that you can see: Any grab bars or handrails. First and last steps. Where the edge of each step is. Use tools that help you move around (mobility aids) if they are needed. These include: Canes. Walkers. Scooters. Crutches. Turn on the lights when you go into a dark area. Replace any light bulbs as soon as they burn out. Set up your furniture so you have a clear path. Avoid moving your furniture around. If any of your floors are uneven, fix them. If there are any pets around you, be aware of where they are. Review your medicines with your doctor. Some medicines can make you feel dizzy. This can increase your chance of falling. Ask your doctor what other things that you can do to help prevent falls. This information is not intended to replace advice given to you by your health care provider. Make sure you discuss any questions you have with your health care provider. Document Released: 10/31/2008 Document Revised: 06/12/2015 Document Reviewed: 02/08/2014 Elsevier Interactive Patient Education  2017 [employer].

## 2020-07-24 NOTE — Progress Notes (Signed)
I connected with Nathan Fleming today by telephone and verified that I am speaking with the correct person using two identifiers. Location patient: home Location provider: work Persons participating in the virtual visit: Nathan Fleming and Lisette Abu, LPN.   I discussed the limitations, risks, security and privacy concerns of performing an evaluation and management service by telephone and the availability of in person appointments. I also discussed with the patient that there may be a patient responsible charge related to this service. The patient expressed understanding and verbally consented to this telephonic visit.    Interactive audio and video telecommunications were attempted between this provider and patient, however failed, due to patient having technical difficulties OR patient did not have access to video capability.  We continued and completed visit with audio only.  Some vital signs may be absent or patient reported.   Time Spent with patient on telephone encounter: 30 minutes  Subjective:   Nathan Fleming is a 65 y.o. male who presents for Medicare Annual/Subsequent preventive examination.  Review of Systems     Cardiac Risk Factors include: advanced age (>93mn, >>21women);dyslipidemia;family history of premature cardiovascular disease;hypertension;male gender;obesity (BMI >30kg/m2)     Objective:    There were no vitals filed for this visit. There is no height or weight on file to calculate BMI.  Advanced Directives 07/24/2020 10/11/2013  Does Patient Have a Medical Advance Directive? No No  Would patient like information on creating a medical advance directive? Yes (MAU/Ambulatory/Procedural Areas - Information given) No - patient declined information    Current Medications (verified) Outpatient Encounter Medications as of 07/24/2020  Medication Sig   albuterol (VENTOLIN HFA) 108 (90 Base) MCG/ACT inhaler Inhale 2 puffs into the lungs every 6 (six) hours as needed  for wheezing or shortness of breath.   Blood Pressure KIT Take blood pressure reading once a day at different times for the next several weeks.   chlorthalidone (HYGROTON) 25 MG tablet Take 0.5 tablets (12.5 mg total) by mouth daily.   simvastatin (ZOCOR) 80 MG tablet TAKE 1 TABLET BY MOUTH DAILY AT 6PM   triamcinolone (NASACORT) 55 MCG/ACT AERO nasal inhaler Place 2 sprays into the nose daily.   Blood Pressure Monitoring (BLOOD PRESSURE MONITOR/WRIST) DEVI 1 Device by Does not apply route daily.   cetirizine (ZYRTEC) 10 MG tablet Take 1 tablet (10 mg total) by mouth daily for 7 days.   clotrimazole-betamethasone (LOTRISONE) cream Apply 1 application topically 2 (two) times daily. (Patient not taking: No sig reported)   Menthol, Topical Analgesic, (BIOFREEZE EX) Apply 1 application topically daily as needed (knee pain). (Patient not taking: Reported on 04/22/2020)   rosuvastatin (CRESTOR) 5 MG tablet Take 1 tablet (5 mg total) by mouth daily. Patient to fill this or the simvastatin. (Patient not taking: Reported on 07/24/2020)   sildenafil (VIAGRA) 100 MG tablet Take 0.5-1 tablets (50-100 mg total) by mouth daily as needed for erectile dysfunction. (Patient not taking: No sig reported)   No facility-administered encounter medications on file as of 07/24/2020.    Allergies (verified) Patient has no known allergies.   History: Past Medical History:  Diagnosis Date   Allergy    Arthritis    Hyperlipidemia    Past Surgical History:  Procedure Laterality Date   HIP SURGERY     KNEE SURGERY     SHOULDER SURGERY     Family History  Problem Relation Age of Onset   Diabetes Mother    Stroke Father    Diabetes  Sister    Thyroid disease Neg Hx    Social History   Socioeconomic History   Marital status: Married    Spouse name: Not on file   Number of children: Not on file   Years of education: Not on file   Highest education level: Not on file  Occupational History   Occupation: retired   Tobacco Use   Smoking status: Former    Packs/day: 0.25    Years: 10.00    Pack years: 2.50    Types: Cigarettes    Quit date: 01/19/1988    Years since quitting: 32.5   Smokeless tobacco: Never  Substance and Sexual Activity   Alcohol use: No   Drug use: No   Sexual activity: Yes  Other Topics Concern   Not on file  Social History Narrative   Divorced; Moved from [place] in 2014   Retired; has 1 son   Social Determinants of Radio broadcast assistant Strain: Low Risk    Difficulty of Paying Living Expenses: Not hard at all  Food Insecurity: No Food Insecurity   Worried About Charity fundraiser in the Last Year: Never true   Arboriculturist in the Last Year: Never true  Transportation Needs: No Transportation Needs   Lack of Transportation (Medical): No   Lack of Transportation (Non-Medical): No  Physical Activity: Sufficiently Active   Days of Exercise per Week: 5 days   Minutes of Exercise per Session: 30 min  Stress: No Stress Concern Present   Feeling of Stress : Not at all  Social Connections: Moderately Integrated   Frequency of Communication with Friends and Family: More than three times a week   Frequency of Social Gatherings with Friends and Family: Once a week   Attends Religious Services: More than 4 times per year   Active Member of [employer] or Organizations: No   Attends Music therapist: More than 4 times per year   Marital Status: Divorced    Tobacco Counseling Counseling given: Not Answered   Clinical Intake:  Pre-visit preparation completed: Yes  Pain : No/denies pain     Nutritional Risks: None Diabetes: No  How often do you need to have someone help you when you read instructions, pamphlets, or other written materials from your doctor or pharmacy?: 1 - Never What is the last grade level you completed in school?: High School Graduate  Diabetic? no  Interpreter Needed?: No  Information entered by :: Lisette Abu,  LPN   Activities of Daily Living In your present state of health, do you have any difficulty performing the following activities: 07/24/2020  Hearing? N  Vision? Y  Difficulty concentrating or making decisions? N  Walking or climbing stairs? Y  Dressing or bathing? N  Doing errands, shopping? N  Preparing Food and eating ? N  Using the Toilet? N  In the past six months, have you accidently leaked urine? N  Do you have problems with loss of bowel control? N  Managing your Medications? N  Managing your Finances? N  Housekeeping or managing your Housekeeping? N  Some recent data might be hidden    Patient Care Team: Horald Pollen, MD as PCP - General (Internal Medicine) [place], Antionette Fairy, MD as Consulting Physician (Ophthalmology)  Indicate any recent Medical Services you may have received from other than [facility] providers in the past year (date may be approximate).     Assessment:   This is a routine wellness  examination for [facility].  Hearing/Vision screen Hearing Screening - Comments:: Patient denied any hearing difficulty. Vision Screening - Comments:: Patient has cataracts on both eyes.  Surgery scheduled for 08/06/2020 and 08/27/2020 with Dr. Olene Craven.  Dietary issues and exercise activities discussed: Current Exercise Habits: Home exercise routine, Type of exercise: treadmill;walking, Time (Minutes): 30, Frequency (Times/Week): 5, Weekly Exercise (Minutes/Week): 150, Intensity: Moderate, Exercise limited by: respiratory conditions(s)   Goals Addressed               This Visit's Progress     Patient Stated (pt-stated)        My goal is to have cataract surgery on both eyes so that I can see better.       Depression Screen PHQ 2/9 Scores 07/24/2020 04/22/2020 02/13/2019 01/22/2019 01/04/2019 07/13/2018 04/25/2018  PHQ - 2 Score 0 0 0 0 0 0 0    Fall Risk Fall Risk  07/24/2020 04/22/2020 02/13/2019 01/22/2019 01/04/2019  Falls in the past year? 0 0 0 0 0  Number falls  in past yr: 0 0 0 - -  Injury with Fall? 0 - 0 - -  Risk for fall due to : Impaired balance/gait No Fall Risks - - -  Follow up Falls evaluation completed Falls evaluation completed Falls evaluation completed Falls evaluation completed Falls evaluation completed    [place]:  Any stairs in or around the home? Yes  If so, are there any without handrails? No  Home free of loose throw rugs in walkways, pet beds, electrical cords, etc? Yes  Adequate lighting in your home to reduce risk of falls? Yes   ASSISTIVE DEVICES UTILIZED TO PREVENT FALLS:  Life alert? No  Use of a cane, walker or w/c? Yes  Grab bars in the bathroom? Yes  Shower chair or bench in shower? No  Elevated toilet seat or a handicapped toilet? Yes   TIMED UP AND GO:  Was the test performed? No .  Length of time to ambulate 10 feet: 0 sec.   Gait steady and fast with assistive device  Cognitive Function: Normal cognitive status assessed by direct observation by this Nurse Health Advisor. No abnormalities found.          Immunizations Immunization History  Administered Date(s) Administered   Influenza,inj,Quad PF,6+ Mos 03/02/2013, 01/03/2016, 09/10/2016   Influenza-Unspecified 10/31/2019   Janssen (J&J) SARS-COV-2 Vaccination 04/06/2019   PFIZER(Purple Top)SARS-COV-2 Vaccination 01/01/2020   Pneumococcal Conjugate-13 04/22/2020   Tdap 09/10/2016    TDAP status: Up to date  Flu Vaccine status: Up to date  Pneumococcal vaccine status: Up to date  Covid-19 vaccine status: Completed vaccines  Qualifies for Shingles Vaccine? Yes   Zostavax completed No   Shingrix Completed?: No.    Education has been provided regarding the importance of this vaccine. Patient has been advised to call insurance company to determine out of pocket expense if they have not yet received this vaccine. Advised may also receive vaccine at local pharmacy or Health Dept. Verbalized acceptance and  understanding.  Screening Tests Health Maintenance  Topic Date Due   HIV Screening  Never done   Hepatitis C Screening  Never done   Zoster Vaccines- Shingrix (1 of 2) Never done   COVID-19 Vaccine (3 - Booster for Janssen series) 05/01/2020   COLONOSCOPY (Pts 45-37yr Insurance coverage will need to be confirmed)  04/22/2021 (Originally 11/27/1973)   INFLUENZA VACCINE  08/18/2020   PNA vac Low Risk Adult (2 of  2 - PPSV23) 04/22/2021   TETANUS/TDAP  09/11/2026   HPV VACCINES  Aged Out    Health Maintenance  Health Maintenance Due  Topic Date Due   HIV Screening  Never done   Hepatitis C Screening  Never done   Zoster Vaccines- Shingrix (1 of 2) Never done   COVID-19 Vaccine (3 - Booster for Janssen series) 05/01/2020    Colorectal cancer screening: Referral to GI placed 07/11/2020. Pt aware the office will call re: appt.  Lung Cancer Screening: (Low Dose CT Chest recommended if Age 66-80 years, 30 pack-year currently smoking OR have quit w/in 15years.) does not qualify.   Lung Cancer Screening Referral: no  Additional Screening:  Hepatitis C Screening: does qualify; Completed: no  Vision Screening: Recommended annual ophthalmology exams for early detection of glaucoma and other disorders of the eye. Is the patient up to date with their annual eye exam?  Yes  Who is the provider or what is the name of the office in which the patient attends annual eye exams? Dr. Olene Craven If pt is not established with a provider, would they like to be referred to a provider to establish care? No .   Dental Screening: Recommended annual dental exams for proper oral hygiene  Community Resource Referral / Chronic Care Management: CRR required this visit?  No   CCM required this visit?  No      Plan:     I have personally reviewed and noted the following in the patient's chart:   Medical and social history Use of alcohol, tobacco or illicit drugs  Current medications and  supplements including opioid prescriptions. Patient is not currently taking opioid prescriptions. Functional ability and status Nutritional status Physical activity Advanced directives List of other physicians Hospitalizations, surgeries, and ER visits in previous 12 months Vitals Screenings to include cognitive, depression, and falls Referrals and appointments  In addition, I have reviewed and discussed with patient certain preventive protocols, quality metrics, and best practice recommendations. A written personalized care plan for preventive services as well as general preventive health recommendations were provided to patient.     Nathan Flow, LPN   4/0/3709   Nurse Notes:  Patient is cogitatively intact. There were no vitals filed for this visit. There is no height or weight on file to calculate BMI.

## 2020-08-06 DIAGNOSIS — H25812 Combined forms of age-related cataract, left eye: Secondary | ICD-10-CM | POA: Diagnosis not present

## 2020-08-06 DIAGNOSIS — H2512 Age-related nuclear cataract, left eye: Secondary | ICD-10-CM | POA: Diagnosis not present

## 2020-08-06 DIAGNOSIS — H2511 Age-related nuclear cataract, right eye: Secondary | ICD-10-CM | POA: Diagnosis not present

## 2020-08-28 ENCOUNTER — Telehealth: Payer: Self-pay | Admitting: Emergency Medicine

## 2020-08-28 NOTE — Telephone Encounter (Signed)
Patient called in about COVID exposure  Patient was around someone who tested positive 08.09.22.. patient took at home test same day & was negative  Patient says he has small cough, feeling feverish but no fever  Would still like to speak to provider or nurse  Req callback [phone]

## 2020-08-29 NOTE — Telephone Encounter (Signed)
No need to panic. Just monitor symptoms for now. Thanks.

## 2020-09-01 NOTE — Telephone Encounter (Signed)
Called and spoke with pt, he states that he took a home COVID test, which came back negative. He states that he feels fine and he appreciates the call.

## 2020-10-08 DIAGNOSIS — H2511 Age-related nuclear cataract, right eye: Secondary | ICD-10-CM | POA: Diagnosis not present

## 2020-10-09 ENCOUNTER — Other Ambulatory Visit: Payer: Self-pay | Admitting: Emergency Medicine

## 2020-10-09 DIAGNOSIS — I10 Essential (primary) hypertension: Secondary | ICD-10-CM

## 2020-10-21 ENCOUNTER — Ambulatory Visit: Payer: Medicare HMO | Admitting: Emergency Medicine

## 2020-10-28 ENCOUNTER — Ambulatory Visit: Payer: Medicare HMO | Admitting: Emergency Medicine

## 2020-11-12 ENCOUNTER — Ambulatory Visit: Payer: Medicare HMO | Admitting: Emergency Medicine

## 2020-11-18 ENCOUNTER — Other Ambulatory Visit: Payer: Self-pay

## 2020-11-18 ENCOUNTER — Encounter (INDEPENDENT_AMBULATORY_CARE_PROVIDER_SITE_OTHER): Payer: Self-pay | Admitting: Ophthalmology

## 2020-11-18 ENCOUNTER — Encounter (INDEPENDENT_AMBULATORY_CARE_PROVIDER_SITE_OTHER): Payer: Medicare HMO | Admitting: Ophthalmology

## 2020-11-18 ENCOUNTER — Ambulatory Visit (INDEPENDENT_AMBULATORY_CARE_PROVIDER_SITE_OTHER): Payer: Medicare HMO | Admitting: Ophthalmology

## 2020-11-18 ENCOUNTER — Encounter (INDEPENDENT_AMBULATORY_CARE_PROVIDER_SITE_OTHER): Payer: Self-pay

## 2020-11-18 DIAGNOSIS — H35371 Puckering of macula, right eye: Secondary | ICD-10-CM | POA: Insufficient documentation

## 2020-11-18 DIAGNOSIS — H35352 Cystoid macular degeneration, left eye: Secondary | ICD-10-CM | POA: Diagnosis not present

## 2020-11-18 DIAGNOSIS — H35372 Puckering of macula, left eye: Secondary | ICD-10-CM

## 2020-11-18 DIAGNOSIS — Z961 Presence of intraocular lens: Secondary | ICD-10-CM | POA: Diagnosis not present

## 2020-11-18 DIAGNOSIS — H35373 Puckering of macula, bilateral: Secondary | ICD-10-CM | POA: Diagnosis not present

## 2020-11-18 NOTE — Assessment & Plan Note (Addendum)
Significant epiretinal membrane fovea right eye, with impact on acuity particularly reading acuity, best chance of visual acuity improvement is the right eye as it has no demonstrable CME by OCT  I will explained to the patient that vitrectomy membrane peel is his only opportunity to stabilize and improve acuity.  Given his body weight, thus elevated BMI, we would need to do this at [facility] in a nonemergent sleep basis under local anesthesia is similar to what cataract surgery experience was.

## 2020-11-18 NOTE — Progress Notes (Signed)
11/18/2020     CHIEF COMPLAINT Patient presents for  Chief Complaint  Patient presents with   Retina Evaluation      HISTORY OF PRESENT ILLNESS: Nathan Fleming is a 65 y.o. male who presents to the clinic today for:   HPI     Retina Evaluation   In both eyes.  Associated Symptoms Floaters.        Comments   NP- ERM OU OCT- referred by Dr. Gwynn Burly. Pt had appt with Dr Gwynn Burly at 8 AM. Pt states "I see a couple dots every once in a while, in the left eye. I see well since the cataract surgery. I use readers and those work good for me too. I had the floater, then after the surgery I didn't have it anymore. Then it started again a couple days ago."        Last edited by Laurin Coder on 11/18/2020 10:42 AM.      Referring physician: Odette Fraction [place],  [place] [address]  HISTORICAL INFORMATION:   Selected notes from the MEDICAL RECORD NUMBER    Lab Results  Component Value Date   HGBA1C 5.7 04/22/2020     CURRENT MEDICATIONS: No current outpatient medications on file. (Ophthalmic Drugs)   No current facility-administered medications for this visit. (Ophthalmic Drugs)   Current Outpatient Medications (Other)  Medication Sig   albuterol (VENTOLIN HFA) 108 (90 Base) MCG/ACT inhaler Inhale 2 puffs into the lungs every 6 (six) hours as needed for wheezing or shortness of breath.   Blood Pressure KIT Take blood pressure reading once a day at different times for the next several weeks.   Blood Pressure Monitoring (BLOOD PRESSURE MONITOR/WRIST) DEVI 1 Device by Does not apply route daily.   cetirizine (ZYRTEC) 10 MG tablet Take 1 tablet (10 mg total) by mouth daily for 7 days.   chlorthalidone (HYGROTON) 25 MG tablet TAKE 1/2 TABLET BY MOUTH EVERY DAY   clotrimazole-betamethasone (LOTRISONE) cream Apply 1 application topically 2 (two) times daily. (Patient not taking: No sig reported)   Menthol, Topical Analgesic, (BIOFREEZE EX) Apply 1  application topically daily as needed (knee pain). (Patient not taking: Reported on 04/22/2020)   rosuvastatin (CRESTOR) 5 MG tablet Take 1 tablet (5 mg total) by mouth daily. Patient to fill this or the simvastatin. (Patient not taking: Reported on 07/24/2020)   sildenafil (VIAGRA) 100 MG tablet Take 0.5-1 tablets (50-100 mg total) by mouth daily as needed for erectile dysfunction. (Patient not taking: No sig reported)   simvastatin (ZOCOR) 80 MG tablet TAKE 1 TABLET BY MOUTH DAILY AT 6PM   triamcinolone (NASACORT) 55 MCG/ACT AERO nasal inhaler Place 2 sprays into the nose daily.   No current facility-administered medications for this visit. (Other)      REVIEW OF SYSTEMS:    ALLERGIES No Known Allergies  PAST MEDICAL HISTORY Past Medical History:  Diagnosis Date   Allergy    Arthritis    Hyperlipidemia    Past Surgical History:  Procedure Laterality Date   HIP SURGERY     KNEE SURGERY     SHOULDER SURGERY      FAMILY HISTORY Family History  Problem Relation Age of Onset   Diabetes Mother    Stroke Father    Diabetes Sister    Thyroid disease Neg Hx     SOCIAL HISTORY Social History   Tobacco Use   Smoking status: Former    Packs/day: 0.25  Years: 10.00    Pack years: 2.50    Types: Cigarettes    Quit date: 01/19/1988    Years since quitting: 32.8   Smokeless tobacco: Never  Substance Use Topics   Alcohol use: No   Drug use: No         OPHTHALMIC EXAM:  Base Eye Exam     Visual Acuity (ETDRS)       Right Left   Dist [place] 20/70 +2 20/70 +2   Dist ph [place] 20/40 -2 20/50 -1  OS, head tilt.        Tonometry (Tonopen, 10:49 AM)       Right Left   Pressure 22 19  Squeezing eyes        Pupils       Dark Shape APD   Right Pharma. Dilated Irregular None   Left Pharma. Dilated Irregular None         Visual Fields (Counting fingers)       Left Right    Full Full         Extraocular Movement       Right Left    Full Full          Neuro/Psych     Oriented x3: Yes   Mood/Affect: Normal         Dilation     Both eyes: 1.0% Mydriacyl, 2.5% Phenylephrine @ 10:49 AM  Dilated at Dr. Verner Chol office this morning.          Slit Lamp and Fundus Exam     External Exam       Right Left   External Normal Normal         Slit Lamp Exam       Right Left   Lids/Lashes Normal Normal   Conjunctiva/Sclera White and quiet White and quiet   Cornea Clear Clear   Anterior Chamber Deep and quiet Deep and quiet   Iris Round and reactive Round and reactive   Lens Centered posterior chamber intraocular lens Centered posterior chamber intraocular lens   Anterior Vitreous Normal Normal         Fundus Exam       Right Left   Posterior Vitreous Normal Normal   Disc Normal Normal   C/D Ratio 0.25 0.3   Macula Epiretinal membrane, moderate to severe topo distortion Epiretinal membrane, moderate to severe topo distortion   Vessels Normal Normal   Periphery Normal Normal            IMAGING AND PROCEDURES  Imaging and Procedures for 11/18/20  Color Fundus Photography Optos - OU - Both Eyes       Right Eye Progression has no prior data. Macula : epiretinal membrane. Vessels : normal observations. Periphery : normal observations.   Left Eye Progression has no prior data. Macula : epiretinal membrane. Vessels : normal observations. Periphery : normal observations.      OCT, Retina - OU - Both Eyes       Right Eye Quality was good. Scan locations included subfoveal. Central Foveal Thickness: 380. Findings include abnormal foveal contour, epiretinal membrane.   Left Eye Quality was good. Scan locations included subfoveal. Central Foveal Thickness: 368. Findings include abnormal foveal contour, epiretinal membrane, cystoid macular edema.   Notes Severe topographic distortion and macular thickening right eye.  Coincident similar findings in the left,  coincident with Cystoid macular edema  ASSESSMENT/PLAN:  Macular pucker, left eye OS, significant epiretinal membrane hampering activities of functioning with seeing small detail  Right epiretinal membrane Significant epiretinal membrane fovea right eye, with impact on acuity particularly reading acuity, best chance of visual acuity improvement is the right eye as it has no demonstrable CME by OCT  I will explained to the patient that vitrectomy membrane peel is his only opportunity to stabilize and improve acuity.  Given his body weight, thus elevated BMI, we would need to do this at [facility] in a nonemergent sleep basis under local anesthesia is similar to what cataract surgery experience was.     ICD-10-CM   1. Macular pucker, left eye  H35.372 Color Fundus Photography Optos - OU - Both Eyes    OCT, Retina - OU - Both Eyes    2. Right epiretinal membrane  H35.371 Color Fundus Photography Optos - OU - Both Eyes    OCT, Retina - OU - Both Eyes    3. Pseudophakia, both eyes  Z96.1     4. Cystoid macular edema of left eye  H35.352 OCT, Retina - OU - Both Eyes      1.  OD, OS each with severe epiretinal membrane.  2.  OD, appears to be the best chance of visual acuity improvement with vitrectomy membrane peel.  3.  Patient reports not having doing "well with anesthesia" thus I did explain that if we did his surgery at [facility] we would do so with local MAC anesthesia but with no IVD's sedation and simply use local retrobulbar are lidocaine and music to keep the nerves, we do the 15 to 20-minute procedure without pain  Ophthalmic Meds Ordered this visit:  No orders of the defined types were placed in this encounter.      Return ,, [facility], no IV sedation, for Schedule vitrectomy, membrane peel, [id number].  There are no Patient Instructions on file for this visit.   Explained the diagnoses, plan, and follow up with the patient and they expressed understanding.  Patient expressed  understanding of the importance of proper follow up care.   Clent Demark Petra Sargeant M.D. Diseases & Surgery of the Retina and Vitreous Retina [facility] Di[facility] [place] 11/18/20     Abbreviations: M myopia (nearsighted); A astigmatism; H hyperopia (farsighted); P presbyopia; Mrx spectacle prescription;  CTL contact lenses; OD right eye; OS left eye; OU both eyes  XT exotropia; ET esotropia; PEK punctate epithelial keratitis; PEE punctate epithelial erosions; DES dry eye syndrome; MGD meibomian gland dysfunction; ATs artificial tears; PFAT's preservative free artificial tears; [place] nuclear sclerotic cataract; PSC posterior subcapsular cataract; ERM epi-retinal membrane; PVD posterior vitreous detachment; RD retinal detachment; DM diabetes mellitus; DR diabetic retinopathy; NPDR non-proliferative diabetic retinopathy; PDR proliferative diabetic retinopathy; CSME clinically significant macular edema; DME diabetic macular edema; dbh dot blot hemorrhages; CWS cotton wool spot; POAG primary open angle glaucoma; C/D cup-to-disc ratio; HVF humphrey visual field; GVF goldmann visual field; OCT optical coherence tomography; IOP intraocular pressure; BRVO Branch retinal vein occlusion; CRVO central retinal vein occlusion; CRAO central retinal artery occlusion; BRAO branch retinal artery occlusion; RT retinal tear; SB scleral buckle; PPV pars plana vitrectomy; VH Vitreous hemorrhage; PRP panretinal laser photocoagulation; IVK intravitreal kenalog; VMT vitreomacular traction; MH Macular hole;  NVD neovascularization of the disc; NVE neovascularization elsewhere; AREDS age related eye disease study; ARMD age related macular degeneration; POAG primary open angle glaucoma; EBMD epithelial/anterior basement membrane dystrophy; ACIOL anterior chamber intraocular lens; IOL intraocular lens;  PCIOL posterior chamber intraocular lens; Phaco/IOL phacoemulsification with intraocular lens placement; [place] photorefractive keratectomy; LASIK laser  assisted in situ keratomileusis; HTN hypertension; DM diabetes mellitus; COPD chronic obstructive pulmonary disease

## 2020-11-18 NOTE — Assessment & Plan Note (Signed)
OS, significant epiretinal membrane hampering activities of functioning with seeing small detail

## 2020-11-26 ENCOUNTER — Ambulatory Visit (INDEPENDENT_AMBULATORY_CARE_PROVIDER_SITE_OTHER): Payer: Medicare HMO

## 2020-11-27 ENCOUNTER — Other Ambulatory Visit: Payer: Self-pay

## 2020-11-27 ENCOUNTER — Ambulatory Visit (INDEPENDENT_AMBULATORY_CARE_PROVIDER_SITE_OTHER): Payer: Medicare HMO | Admitting: Emergency Medicine

## 2020-11-27 ENCOUNTER — Encounter: Payer: Self-pay | Admitting: Emergency Medicine

## 2020-11-27 VITALS — BP 132/82 | HR 93 | Temp 98.4°F | Ht 74.0 in | Wt 354.0 lb

## 2020-11-27 DIAGNOSIS — Z23 Encounter for immunization: Secondary | ICD-10-CM | POA: Diagnosis not present

## 2020-11-27 DIAGNOSIS — Z1211 Encounter for screening for malignant neoplasm of colon: Secondary | ICD-10-CM | POA: Diagnosis not present

## 2020-11-27 DIAGNOSIS — H35033 Hypertensive retinopathy, bilateral: Secondary | ICD-10-CM | POA: Diagnosis not present

## 2020-11-27 DIAGNOSIS — I10 Essential (primary) hypertension: Secondary | ICD-10-CM

## 2020-11-27 DIAGNOSIS — E785 Hyperlipidemia, unspecified: Secondary | ICD-10-CM | POA: Diagnosis not present

## 2020-11-27 DIAGNOSIS — Z6841 Body Mass Index (BMI) 40.0 and over, adult: Secondary | ICD-10-CM | POA: Diagnosis not present

## 2020-11-27 DIAGNOSIS — R262 Difficulty in walking, not elsewhere classified: Secondary | ICD-10-CM

## 2020-11-27 NOTE — Assessment & Plan Note (Signed)
Well-controlled hypertension.  Continue [place] 25 mg daily. BP Readings from Last 3 Encounters:  11/27/20 132/82  04/22/20 132/80  02/13/19 140/82

## 2020-11-27 NOTE — Assessment & Plan Note (Signed)
Some mild weakness to both legs.  May benefit from physical therapy.  Referral placed today.

## 2020-11-27 NOTE — Assessment & Plan Note (Signed)
No progress losing weight.  Diet and nutrition discussed.

## 2020-11-27 NOTE — Assessment & Plan Note (Signed)
Vision improved.  He will follow-up with retinal specialist.

## 2020-11-27 NOTE — Assessment & Plan Note (Signed)
Diet and nutrition discussed.  Continue simvastatin 80 mg daily. The 10-year ASCVD risk score (Arnett DK, et al., 2019) is: 18.7%   Values used to calculate the score:     Age: 65 years     Sex: Male     Is Non-Hispanic African American: Yes     Diabetic: No     Tobacco smoker: No     Systolic Blood Pressure: 732 mmHg     Is BP treated: Yes     HDL Cholesterol: 36.5 mg/dL     Total Cholesterol: 184 mg/dL

## 2020-11-27 NOTE — Progress Notes (Signed)
Nathan Fleming 65 y.o.   Chief Complaint  Patient presents with   Hypertension    6 month F/U    HISTORY OF PRESENT ILLNESS: This is a 65 y.o. male with history of hypertension here for follow-up.  Presently taking chlorthalidone 25 mg daily. Recently saw ophthalmologist who referred patient to retinal specialist due to hypertensive retinopathy. Patient states vision has improved since last time I saw him.  Ophthalmology report reviewed with patient. Also still taking simvastatin 80 mg daily. Walks with a cane.  Inquiring about physical therapy for his legs. Has no complaints or medical concerns today. Normal blood pressure readings at home. BP Readings from Last 3 Encounters:  11/27/20 132/82  04/22/20 132/80  02/13/19 140/82     Hypertension Pertinent negatives include no chest pain, headaches, palpitations or shortness of breath.    Prior to Admission medications   Medication Sig Start Date End Date Taking? Authorizing Provider  albuterol (VENTOLIN HFA) 108 (90 Base) MCG/ACT inhaler Inhale 2 puffs into the lungs every 6 (six) hours as needed for wheezing or shortness of breath. 04/22/20  Yes Teyanna Thielman, Ines Bloomer, MD  Blood Pressure KIT Take blood pressure reading once a day at different times for the next several weeks. 04/22/20  Yes Dacen Frayre, Ines Bloomer, MD  Blood Pressure Monitoring (BLOOD PRESSURE MONITOR/WRIST) DEVI 1 Device by Does not apply route daily. 08/30/13  Yes Barton Fanny, MD  chlorthalidone (HYGROTON) 25 MG tablet TAKE 1/2 TABLET BY MOUTH EVERY DAY 10/09/20  Yes Joeph Szatkowski, Ines Bloomer, MD  clotrimazole-betamethasone (LOTRISONE) cream Apply 1 application topically 2 (two) times daily. 02/13/19  Yes Trenice Mesa, Ines Bloomer, MD  Menthol, Topical Analgesic, (BIOFREEZE EX) Apply 1 application topically daily as needed (knee pain).   Yes [provider]  rosuvastatin (CRESTOR) 5 MG tablet Take 1 tablet (5 mg total) by mouth daily. Patient to fill this or the  simvastatin. 09/01/17  Yes Tereasa Coop, PA-C  sildenafil (VIAGRA) 100 MG tablet Take 0.5-1 tablets (50-100 mg total) by mouth daily as needed for erectile dysfunction. 07/13/18  Yes Horald Pollen, MD  simvastatin (ZOCOR) 80 MG tablet TAKE 1 TABLET BY MOUTH DAILY AT 6PM 02/02/20  Yes Willisha Sligar, Ines Bloomer, MD  triamcinolone (NASACORT) 55 MCG/ACT AERO nasal inhaler Place 2 sprays into the nose daily. 04/22/20  Yes Nicol Herbig, Ines Bloomer, MD  cetirizine (ZYRTEC) 10 MG tablet Take 1 tablet (10 mg total) by mouth daily for 7 days. 04/25/18 05/02/18  Horald Pollen, MD    No Known Allergies  Patient Active Problem List   Diagnosis Date Noted   Macular pucker, left eye 11/18/2020   Right epiretinal membrane 11/18/2020   Pseudophakia, both eyes 11/18/2020   Essential hypertension 04/25/2018   Seasonal allergies 04/25/2018   Prediabetes 09/26/2017   Bilateral low back pain without sciatica 10/01/2014   Elevated blood-pressure reading without diagnosis of hypertension 10/01/2014   Allergic rhinitis 08/20/2014   Chronic edema 08/20/2014   Hyperthyroidism 09/26/2013   Other testicular hypofunction 09/26/2013   Erectile dysfunction 08/30/2013   Dyslipidemia 03/04/2013   Obesity, morbid ([place]) 03/04/2013    Past Medical History:  Diagnosis Date   Allergy    Arthritis    Hyperlipidemia     Past Surgical History:  Procedure Laterality Date   HIP SURGERY     KNEE SURGERY     SHOULDER SURGERY      Social History   Socioeconomic History   Marital status: Married    Spouse name: Not  on file   Number of children: Not on file   Years of education: Not on file   Highest education level: Not on file  Occupational History   Occupation: retired  Tobacco Use   Smoking status: Former    Packs/day: 0.25    Years: 10.00    Pack years: 2.50    Types: Cigarettes    Quit date: 01/19/1988    Years since quitting: 32.8   Smokeless tobacco: Never  Substance and Sexual Activity    Alcohol use: No   Drug use: No   Sexual activity: Yes  Other Topics Concern   Not on file  Social History Narrative   Divorced; Moved from [place] in 2014   Retired; has 1 son   Social Determinants of Radio broadcast assistant Strain: Low Risk    Difficulty of Paying Living Expenses: Not hard at all  Food Insecurity: No Food Insecurity   Worried About Charity fundraiser in the Last Year: Never true   Arboriculturist in the Last Year: Never true  Transportation Needs: No Transportation Needs   Lack of Transportation (Medical): No   Lack of Transportation (Non-Medical): No  Physical Activity: Sufficiently Active   Days of Exercise per Week: 5 days   Minutes of Exercise per Session: 30 min  Stress: No Stress Concern Present   Feeling of Stress : Not at all  Social Connections: Moderately Integrated   Frequency of Communication with Friends and Family: More than three times a week   Frequency of Social Gatherings with Friends and Family: Once a week   Attends Religious Services: More than 4 times per year   Active Member of [employer] or Organizations: No   Attends Music therapist: More than 4 times per year   Marital Status: Divorced  Human resources officer Violence: Not At Risk   Fear of Current or Ex-Partner: No   Emotionally Abused: No   Physically Abused: No   Sexually Abused: No    Family History  Problem Relation Age of Onset   Diabetes Mother    Stroke Father    Diabetes Sister    Thyroid disease Neg Hx      Review of Systems  Constitutional: Negative.  Negative for chills and fever.  HENT: Negative.  Negative for congestion and sore throat.   Respiratory: Negative.  Negative for cough and shortness of breath.   Cardiovascular: Negative.  Negative for chest pain and palpitations.  Gastrointestinal: Negative.  Negative for abdominal pain, diarrhea, nausea and vomiting.  Musculoskeletal:  Negative for back pain and joint pain.       Ambulation issues.  Walks  with a cane.  Skin: Negative.  Negative for rash.  Neurological: Negative.  Negative for dizziness and headaches.  All other systems reviewed and are negative.  Today's Vitals   11/27/20 0845  BP: 132/82  Pulse: 93  Temp: 98.4 F (36.9 C)  TempSrc: Oral  SpO2: 98%  Weight: (!) 354 lb (160.6 kg)  Height: _0  (1.88 m)   Body mass index is 45.45 kg/m. Wt Readings from Last 3 Encounters:  11/27/20 (!) 354 lb (160.6 kg)  04/22/20 (!) 344 lb (156 kg)  02/13/19 (!) 360 lb 3.2 oz (163.4 kg)    Physical Exam Constitutional:      Appearance: He is obese.  Eyes:     Extraocular Movements: Extraocular movements intact.     Pupils: Pupils are equal, round, and reactive to light.  Cardiovascular:     Rate and Rhythm: Normal rate and regular rhythm.     Pulses: Normal pulses.     Heart sounds: Normal heart sounds.  Pulmonary:     Effort: Pulmonary effort is normal.     Breath sounds: Normal breath sounds.  Musculoskeletal:        General: No swelling or tenderness.     Right lower leg: Edema present.     Left lower leg: Edema present.  Lymphadenopathy:     Cervical: No cervical adenopathy.  Skin:    General: Skin is warm and dry.     Capillary Refill: Capillary refill takes less than 2 seconds.  Neurological:     General: No focal deficit present.     Mental Status: He is alert and oriented to person, place, and time.  Psychiatric:        Mood and Affect: Mood normal.        Behavior: Behavior normal.     ASSESSMENT & PLAN: Problem List Items Addressed This Visit       Cardiovascular and Mediastinum   Essential hypertension - Primary    Well-controlled hypertension.  Continue [place] 25 mg daily. BP Readings from Last 3 Encounters:  11/27/20 132/82  04/22/20 132/80  02/13/19 140/82           Other   Dyslipidemia    Diet and nutrition discussed.  Continue simvastatin 80 mg daily. The 10-year ASCVD risk score (Arnett DK, et al., 2019) is: 18.7%   Values  used to calculate the score:     Age: 55 years     Sex: Male     Is Non-Hispanic African American: Yes     Diabetic: No     Tobacco smoker: No     Systolic Blood Pressure: 496 mmHg     Is BP treated: Yes     HDL Cholesterol: 36.5 mg/dL     Total Cholesterol: 184 mg/dL       Obesity, morbid ([place])    No progress losing weight.  Diet and nutrition discussed.      Hypertensive retinopathy of both eyes    Vision improved.  He will follow-up with retinal specialist.      Impaired ambulation    Some mild weakness to both legs.  May benefit from physical therapy.  Referral placed today.      Relevant Orders   Ambulatory referral to Physical Therapy   Other Visit Diagnoses     Need for influenza vaccination       Relevant Orders   Flu Vaccine QUAD High Dose(Fluad) (Completed)   Colon cancer screening       Relevant Orders   Ambulatory referral to Gastroenterology   Morbid obesity with body mass index (BMI) of 45.0 to 49.9 in adult [facility])          Patient Instructions  Hypertension, Adult High blood pressure (hypertension) is when the force of blood pumping through the arteries is too strong. The arteries are the blood vessels that carry blood from the heart throughout the body. Hypertension forces the heart to work harder to pump blood and may cause arteries to become narrow or stiff. Untreated or uncontrolled hypertension can cause a heart attack, heart failure, a stroke, kidney disease, and other problems. A blood pressure reading consists of a higher number over a lower number. Ideally, your blood pressure should be below 120/80. The first ("top") number is called the systolic pressure. It is a measure  of the pressure in your arteries as your heart beats. The second ("bottom") number is called the diastolic pressure. It is a measure of the pressure in your arteries as the heart relaxes. What are the causes? The exact cause of this condition is not known. There are some  conditions that result in or are related to high blood pressure. What increases the risk? Some risk factors for high blood pressure are under your control. The following factors may make you more likely to develop this condition: Smoking. Having type 2 diabetes mellitus, high cholesterol, or both. Not getting enough exercise or physical activity. Being overweight. Having too much fat, sugar, calories, or salt (sodium) in your diet. Drinking too much alcohol. Some risk factors for high blood pressure may be difficult or impossible to change. Some of these factors include: Having chronic kidney disease. Having a family history of high blood pressure. Age. Risk increases with age. Race. You may be at higher risk if you are African American. Gender. Men are at higher risk than women before age 41. After age 42, women are at higher risk than men. Having obstructive sleep apnea. Stress. What are the signs or symptoms? High blood pressure may not cause symptoms. Very high blood pressure (hypertensive crisis) may cause: Headache. Anxiety. Shortness of breath. Nosebleed. Nausea and vomiting. Vision changes. Severe chest pain. Seizures. How is this diagnosed? This condition is diagnosed by measuring your blood pressure while you are seated, with your arm resting on a flat surface, your legs uncrossed, and your feet flat on the floor. The cuff of the blood pressure monitor will be placed directly against the skin of your upper arm at the level of your heart. It should be measured at least twice using the same arm. Certain conditions can cause a difference in blood pressure between your right and left arms. Certain factors can cause blood pressure readings to be lower or higher than normal for a short period of time: When your blood pressure is higher when you are in a health care provider's office than when you are at home, this is called white coat hypertension. Most people with this condition do  not need medicines. When your blood pressure is higher at home than when you are in a health care provider's office, this is called masked hypertension. Most people with this condition may need medicines to control blood pressure. If you have a high blood pressure reading during one visit or you have normal blood pressure with other risk factors, you may be asked to: Return on a different day to have your blood pressure checked again. Monitor your blood pressure at home for 1 week or longer. If you are diagnosed with hypertension, you may have other blood or imaging tests to help your health care provider understand your overall risk for other conditions. How is this treated? This condition is treated by making healthy lifestyle changes, such as eating healthy foods, exercising more, and reducing your alcohol intake. Your health care provider may prescribe medicine if lifestyle changes are not enough to get your blood pressure under control, and if: Your systolic blood pressure is above 130. Your diastolic blood pressure is above 80. Your personal target blood pressure may vary depending on your medical conditions, your age, and other factors. Follow these instructions at home: Eating and drinking  Eat a diet that is high in fiber and potassium, and low in sodium, added sugar, and fat. An example eating plan is called the DASH (Dietary  Approaches to Stop Hypertension) diet. To eat this way: Eat plenty of fresh fruits and vegetables. Try to fill one half of your plate at each meal with fruits and vegetables. Eat whole grains, such as whole-wheat pasta, brown rice, or whole-grain bread. Fill about one fourth of your plate with whole grains. Eat or drink low-fat dairy products, such as skim milk or low-fat yogurt. Avoid fatty cuts of meat, processed or cured meats, and poultry with skin. Fill about one fourth of your plate with lean proteins, such as fish, chicken without skin, beans, eggs, or  tofu. Avoid pre-made and processed foods. These tend to be higher in sodium, added sugar, and fat. Reduce your daily sodium intake. Most people with hypertension should eat less than 1,500 mg of sodium a day. Do not drink alcohol if: Your health care provider tells you not to drink. You are pregnant, may be pregnant, or are planning to become pregnant. If you drink alcohol: Limit how much you use to: 0-1 drink a day for women. 0-2 drinks a day for men. Be aware of how much alcohol is in your drink. In the [place] one drink equals one 12 oz bottle of beer (355 mL), one 5 oz glass of wine (148 mL), or one 1 oz glass of hard liquor (44 mL). Lifestyle  Work with your health care provider to maintain a healthy body weight or to lose weight. Ask what an ideal weight is for you. Get at least 30 minutes of exercise most days of the week. Activities may include walking, swimming, or biking. Include exercise to strengthen your muscles (resistance exercise), such as Pilates or lifting weights, as part of your weekly exercise routine. Try to do these types of exercises for 30 minutes at least 3 days a week. Do not use any products that contain nicotine or tobacco, such as cigarettes, e-cigarettes, and chewing tobacco. If you need help quitting, ask your health care provider. Monitor your blood pressure at home as told by your health care provider. Keep all follow-up visits as told by your health care provider. This is important. Medicines Take over-the-counter and prescription medicines only as told by your health care provider. Follow directions carefully. Blood pressure medicines must be taken as prescribed. Do not skip doses of blood pressure medicine. Doing this puts you at risk for problems and can make the medicine less effective. Ask your health care provider about side effects or reactions to medicines that you should watch for. Contact a health care provider if you: Think you are having a  reaction to a medicine you are taking. Have headaches that keep coming back (recurring). Feel dizzy. Have swelling in your ankles. Have trouble with your vision. Get help right away if you: Develop a severe headache or confusion. Have unusual weakness or numbness. Feel faint. Have severe pain in your chest or abdomen. Vomit repeatedly. Have trouble breathing. Summary Hypertension is when the force of blood pumping through your arteries is too strong. If this condition is not controlled, it may put you at risk for serious complications. Your personal target blood pressure may vary depending on your medical conditions, your age, and other factors. For most people, a normal blood pressure is less than 120/80. Hypertension is treated with lifestyle changes, medicines, or a combination of both. Lifestyle changes include losing weight, eating a healthy, low-sodium diet, exercising more, and limiting alcohol. This information is not intended to replace advice given to you by your health care provider. Make sure  you discuss any questions you have with your health care provider. Document Revised: 09/14/2017 Document Reviewed: 09/14/2017 Elsevier Patient Education  2022 [place], MD [facility]

## 2020-11-27 NOTE — Patient Instructions (Signed)

## 2020-12-08 ENCOUNTER — Ambulatory Visit (INDEPENDENT_AMBULATORY_CARE_PROVIDER_SITE_OTHER): Payer: Medicare HMO

## 2020-12-15 ENCOUNTER — Other Ambulatory Visit: Payer: Self-pay

## 2020-12-15 ENCOUNTER — Ambulatory Visit (INDEPENDENT_AMBULATORY_CARE_PROVIDER_SITE_OTHER): Payer: Medicare HMO

## 2020-12-15 ENCOUNTER — Encounter (INDEPENDENT_AMBULATORY_CARE_PROVIDER_SITE_OTHER): Payer: Self-pay

## 2020-12-18 ENCOUNTER — Encounter (INDEPENDENT_AMBULATORY_CARE_PROVIDER_SITE_OTHER): Payer: Medicare HMO | Admitting: Ophthalmology

## 2021-01-03 ENCOUNTER — Other Ambulatory Visit: Payer: Self-pay | Admitting: Emergency Medicine

## 2021-01-03 DIAGNOSIS — I10 Essential (primary) hypertension: Secondary | ICD-10-CM

## 2021-04-27 ENCOUNTER — Telehealth: Payer: Self-pay

## 2021-04-27 DIAGNOSIS — Z719 Counseling, unspecified: Secondary | ICD-10-CM

## 2021-04-27 DIAGNOSIS — D492 Neoplasm of unspecified behavior of bone, soft tissue, and skin: Secondary | ICD-10-CM

## 2021-04-27 NOTE — Telephone Encounter (Signed)
Pt is calling wanting a new referral to [facility]. He states that he is finally ready to face the problem. ? ?Please advise ?

## 2021-04-27 NOTE — Telephone Encounter (Signed)
Ask him what is the reason for the referral.  Thanks.

## 2021-04-28 NOTE — Telephone Encounter (Signed)
Okay to refer then.  Thanks.

## 2021-04-28 NOTE — Telephone Encounter (Signed)
Referral place to urology . Patient notified  ?

## 2021-05-08 ENCOUNTER — Telehealth: Payer: Self-pay

## 2021-05-08 DIAGNOSIS — J302 Other seasonal allergic rhinitis: Secondary | ICD-10-CM

## 2021-05-08 MED ORDER — CETIRIZINE HCL 10 MG PO TABS: 10.0000 mg | ORAL_TABLET | Freq: Every day | ORAL | 11 refills | Status: AC

## 2021-05-08 NOTE — Telephone Encounter (Signed)
Pt is requesting a refill for: ?cetirizine (ZYRTEC) 10 MG tablet (Expired) ? ?Pharmacy: ?[id number] [place]? ?LOV 11/27/20 ?ROV  06/02/21 ?

## 2021-05-25 DIAGNOSIS — N475 Adhesions of prepuce and glans penis: Secondary | ICD-10-CM | POA: Diagnosis not present

## 2021-05-25 DIAGNOSIS — N4883 Acquired buried penis: Secondary | ICD-10-CM | POA: Diagnosis not present

## 2021-06-02 ENCOUNTER — Ambulatory Visit (INDEPENDENT_AMBULATORY_CARE_PROVIDER_SITE_OTHER): Payer: Medicare HMO | Admitting: Emergency Medicine

## 2021-06-02 ENCOUNTER — Encounter: Payer: Self-pay | Admitting: Emergency Medicine

## 2021-06-02 VITALS — BP 134/72 | HR 85 | Temp 98.3°F | Ht 74.0 in | Wt 353.0 lb

## 2021-06-02 DIAGNOSIS — Z6841 Body Mass Index (BMI) 40.0 and over, adult: Secondary | ICD-10-CM

## 2021-06-02 DIAGNOSIS — Z9181 History of falling: Secondary | ICD-10-CM | POA: Diagnosis not present

## 2021-06-02 DIAGNOSIS — I10 Essential (primary) hypertension: Secondary | ICD-10-CM

## 2021-06-02 DIAGNOSIS — R7303 Prediabetes: Secondary | ICD-10-CM

## 2021-06-02 DIAGNOSIS — Z1211 Encounter for screening for malignant neoplasm of colon: Secondary | ICD-10-CM

## 2021-06-02 DIAGNOSIS — R262 Difficulty in walking, not elsewhere classified: Secondary | ICD-10-CM

## 2021-06-02 DIAGNOSIS — E785 Hyperlipidemia, unspecified: Secondary | ICD-10-CM | POA: Diagnosis not present

## 2021-06-02 LAB — CBC WITH DIFFERENTIAL/PLATELET
Basophils Absolute: 0 10*3/uL (ref 0.0–0.1)
Basophils Relative: 0.5 % (ref 0.0–3.0)
Eosinophils Absolute: 0 10*3/uL (ref 0.0–0.7)
Eosinophils Relative: 0 % (ref 0.0–5.0)
HCT: 43.6 % (ref 39.0–52.0)
Hemoglobin: 14.5 g/dL (ref 13.0–17.0)
Lymphocytes Relative: 33.7 % (ref 12.0–46.0)
Lymphs Abs: 1.9 10*3/uL (ref 0.7–4.0)
MCHC: 33.2 g/dL (ref 30.0–36.0)
MCV: 89.4 fl (ref 78.0–100.0)
Monocytes Absolute: 0.6 10*3/uL (ref 0.1–1.0)
Monocytes Relative: 11.4 % (ref 3.0–12.0)
Neutro Abs: 3.1 10*3/uL (ref 1.4–7.7)
Neutrophils Relative %: 54.4 % (ref 43.0–77.0)
Platelets: 226 10*3/uL (ref 150.0–400.0)
RBC: 4.88 Mil/uL (ref 4.22–5.81)
RDW: 13.1 % (ref 11.5–15.5)
WBC: 5.7 10*3/uL (ref 4.0–10.5)

## 2021-06-02 LAB — COMPREHENSIVE METABOLIC PANEL
ALT: 18 U/L (ref 0–53)
AST: 19 U/L (ref 0–37)
Albumin: 3.9 g/dL (ref 3.5–5.2)
Alkaline Phosphatase: 75 U/L (ref 39–117)
BUN: 12 mg/dL (ref 6–23)
CO2: 31 mEq/L (ref 19–32)
Calcium: 9.3 mg/dL (ref 8.4–10.5)
Chloride: 100 mEq/L (ref 96–112)
Creatinine, Ser: 1.04 mg/dL (ref 0.40–1.50)
GFR: 74.82 mL/min (ref >60.00)
Glucose, Bld: 96 mg/dL (ref 70–99)
Potassium: 4.3 mEq/L (ref 3.5–5.1)
Sodium: 138 mEq/L (ref 135–145)
Total Bilirubin: 0.9 mg/dL (ref 0.2–1.2)
Total Protein: 7.9 g/dL (ref 6.0–8.3)

## 2021-06-02 LAB — LIPID PANEL
Cholesterol: 157 mg/dL (ref 0–200)
HDL: 33.9 mg/dL — ABNORMAL LOW (ref >39.00)
LDL Cholesterol: 100 mg/dL — ABNORMAL HIGH (ref 0–99)
NonHDL: 123.29
Total CHOL/HDL Ratio: 5
Triglycerides: 115 mg/dL (ref 0.0–149.0)
VLDL: 23 mg/dL (ref 0.0–40.0)

## 2021-06-02 LAB — HEMOGLOBIN A1C: Hgb A1c MFr Bld: 5.8 % (ref 4.6–6.5)

## 2021-06-02 MED ORDER — SIMVASTATIN 80 MG PO TABS: ORAL_TABLET | ORAL | 0 refills | Status: DC

## 2021-06-02 NOTE — Patient Instructions (Signed)
Hypertension, Adult High blood pressure (hypertension) is when the force of blood pumping through the arteries is too strong. The arteries are the blood vessels that carry blood from the heart throughout the body. Hypertension forces the heart to work harder to pump blood and may cause arteries to become narrow or stiff. Untreated or uncontrolled hypertension can lead to a heart attack, heart failure, a stroke, kidney disease, and other problems. A blood pressure reading consists of a higher number over a lower number. Ideally, your blood pressure should be below 120/80. The first ("top") number is called the systolic pressure. It is a measure of the pressure in your arteries as your heart beats. The second ("bottom") number is called the diastolic pressure. It is a measure of the pressure in your arteries as the heart relaxes. What are the causes? The exact cause of this condition is not known. There are some conditions that result in high blood pressure. What increases the risk? Certain factors may make you more likely to develop high blood pressure. Some of these risk factors are under your control, including: Smoking. Not getting enough exercise or physical activity. Being overweight. Having too much fat, sugar, calories, or salt (sodium) in your diet. Drinking too much alcohol. Other risk factors include: Having a personal history of heart disease, diabetes, high cholesterol, or kidney disease. Stress. Having a family history of high blood pressure and high cholesterol. Having obstructive sleep apnea. Age. The risk increases with age. What are the signs or symptoms? High blood pressure may not cause symptoms. Very high blood pressure (hypertensive crisis) may cause: Headache. Fast or irregular heartbeats (palpitations). Shortness of breath. Nosebleed. Nausea and vomiting. Vision changes. Severe chest pain, dizziness, and seizures. How is this diagnosed? This condition is diagnosed by  measuring your blood pressure while you are seated, with your arm resting on a flat surface, your legs uncrossed, and your feet flat on the floor. The cuff of the blood pressure monitor will be placed directly against the skin of your upper arm at the level of your heart. Blood pressure should be measured at least twice using the same arm. Certain conditions can cause a difference in blood pressure between your right and left arms. If you have a high blood pressure reading during one visit or you have normal blood pressure with other risk factors, you may be asked to: Return on a different day to have your blood pressure checked again. Monitor your blood pressure at home for 1 week or longer. If you are diagnosed with hypertension, you may have other blood or imaging tests to help your health care provider understand your overall risk for other conditions. How is this treated? This condition is treated by making healthy lifestyle changes, such as eating healthy foods, exercising more, and reducing your alcohol intake. You may be referred for counseling on a healthy diet and physical activity. Your health care provider may prescribe medicine if lifestyle changes are not enough to get your blood pressure under control and if: Your systolic blood pressure is above 130. Your diastolic blood pressure is above 80. Your personal target blood pressure may vary depending on your medical conditions, your age, and other factors. Follow these instructions at home: Eating and drinking  Eat a diet that is high in fiber and potassium, and low in sodium, added sugar, and fat. An example of this eating plan is called the DASH diet. DASH stands for Dietary Approaches to Stop Hypertension. To eat this way: Eat   plenty of fresh fruits and vegetables. Try to fill one half of your plate at each meal with fruits and vegetables. Eat whole grains, such as whole-wheat pasta, brown rice, or whole-grain bread. Fill about one  fourth of your plate with whole grains. Eat or drink low-fat dairy products, such as skim milk or low-fat yogurt. Avoid fatty cuts of meat, processed or cured meats, and poultry with skin. Fill about one fourth of your plate with lean proteins, such as fish, chicken without skin, beans, eggs, or tofu. Avoid pre-made and processed foods. These tend to be higher in sodium, added sugar, and fat. Reduce your daily sodium intake. Many people with hypertension should eat less than 1,500 mg of sodium a day. Do not drink alcohol if: Your health care provider tells you not to drink. You are pregnant, may be pregnant, or are planning to become pregnant. If you drink alcohol: Limit how much you have to: 0-1 drink a day for women. 0-2 drinks a day for men. Know how much alcohol is in your drink. In the [place] one drink equals one 12 oz bottle of beer (355 mL), one 5 oz glass of wine (148 mL), or one 1 oz glass of hard liquor (44 mL). Lifestyle  Work with your health care provider to maintain a healthy body weight or to lose weight. Ask what an ideal weight is for you. Get at least 30 minutes of exercise that causes your heart to beat faster (aerobic exercise) most days of the week. Activities may include walking, swimming, or biking. Include exercise to strengthen your muscles (resistance exercise), such as Pilates or lifting weights, as part of your weekly exercise routine. Try to do these types of exercises for 30 minutes at least 3 days a week. Do not use any products that contain nicotine or tobacco. These products include cigarettes, chewing tobacco, and vaping devices, such as e-cigarettes. If you need help quitting, ask your health care provider. Monitor your blood pressure at home as told by your health care provider. Keep all follow-up visits. This is important. Medicines Take over-the-counter and prescription medicines only as told by your health care provider. Follow directions carefully. Blood  pressure medicines must be taken as prescribed. Do not skip doses of blood pressure medicine. Doing this puts you at risk for problems and can make the medicine less effective. Ask your health care provider about side effects or reactions to medicines that you should watch for. Contact a health care provider if you: Think you are having a reaction to a medicine you are taking. Have headaches that keep coming back (recurring). Feel dizzy. Have swelling in your ankles. Have trouble with your vision. Get help right away if you: Develop a severe headache or confusion. Have unusual weakness or numbness. Feel faint. Have severe pain in your chest or abdomen. Vomit repeatedly. Have trouble breathing. These symptoms may be an emergency. Get help right away. Call 911. Do not wait to see if the symptoms will go away. Do not drive yourself to the hospital. Summary Hypertension is when the force of blood pumping through your arteries is too strong. If this condition is not controlled, it may put you at risk for serious complications. Your personal target blood pressure may vary depending on your medical conditions, your age, and other factors. For most people, a normal blood pressure is less than 120/80. Hypertension is treated with lifestyle changes, medicines, or a combination of both. Lifestyle changes include losing weight, eating a healthy,   low-sodium diet, exercising more, and limiting alcohol. This information is not intended to replace advice given to you by your health care provider. Make sure you discuss any questions you have with your health care provider. Document Revised: 11/11/2020 Document Reviewed: 11/11/2020 Elsevier Patient Education  2023 Elsevier Inc.  

## 2021-06-02 NOTE — Progress Notes (Signed)
Nathan Fleming ?66 y.o. ? ? ?Chief Complaint  ?Patient presents with  ? Follow-up  ? balance issues   ?  Pt balance if off while walking   ? ? ?HISTORY OF PRESENT ILLNESS: ?This is a 66 y.o. male with history of hypertension and dyslipidemia here for follow-up. ?Recently saw a urologist.  Told prostate was okay. ?Has had ambulation issues for a while.  Addressed during last visit.  Failed to follow-up with physical therapy. ?We will request consult for physical therapy again today. ?Blood pressure numbers doing well at home.  On Hygroton 25 mg daily. ?On simvastatin 80 mg daily. ?No other complaints or medical concerns today. ?BP Readings from Last 3 Encounters:  ?06/02/21 134/72  ?11/27/20 132/82  ?04/22/20 132/80  ? ?Wt Readings from Last 3 Encounters:  ?06/02/21 (!) 353 lb (160.1 kg)  ?11/27/20 (!) 354 lb (160.6 kg)  ?04/22/20 (!) 344 lb (156 kg)  ? ? ? ?HPI ? ? ?Prior to Admission medications   ?Medication Sig Start Date End Date Taking? Authorizing Provider  ?albuterol (VENTOLIN HFA) 108 (90 Base) MCG/ACT inhaler Inhale 2 puffs into the lungs every 6 (six) hours as needed for wheezing or shortness of breath. 04/22/20  Yes Horald Pollen, MD  ?Blood Pressure KIT Take blood pressure reading once a day at different times for the next several weeks. 04/22/20  Yes SagardiaInes Bloomer, MD  ?Blood Pressure Monitoring (BLOOD PRESSURE MONITOR/WRIST) DEVI 1 Device by Does not apply route daily. 08/30/13  Yes Barton Fanny, MD  ?cetirizine (ZYRTEC) 10 MG tablet Take 1 tablet (10 mg total) by mouth daily. 05/08/21 05/03/22 Yes Terrion Poblano, Ines Bloomer, MD  ?chlorthalidone (HYGROTON) 25 MG tablet TAKE 1/2 TABLET BY MOUTH EVERY DAY 01/04/21  Yes Chrystle Murillo, Ines Bloomer, MD  ?clotrimazole-betamethasone (LOTRISONE) cream Apply 1 application topically 2 (two) times daily. 02/13/19  Yes Joselyne Spake, Ines Bloomer, MD  ?Menthol, Topical Analgesic, (BIOFREEZE EX) Apply 1 application topically daily as needed (knee pain).   Yes  [provider]  ?sildenafil (VIAGRA) 100 MG tablet Take 0.5-1 tablets (50-100 mg total) by mouth daily as needed for erectile dysfunction. 07/13/18  Yes Avyon Herendeen, Ines Bloomer, MD  ?simvastatin (ZOCOR) 80 MG tablet TAKE 1 TABLET BY MOUTH DAILY AT 6PM 02/02/20  Yes Kanyla Omeara, Ines Bloomer, MD  ?triamcinolone (NASACORT) 55 MCG/ACT AERO nasal inhaler Place 2 sprays into the nose daily. 04/22/20  Yes Horald Pollen, MD  ? ? ?No Known Allergies ? ?Patient Active Problem List  ? Diagnosis Date Noted  ? Hypertensive retinopathy of both eyes 11/27/2020  ? Impaired ambulation 11/27/2020  ? Macular pucker, left eye 11/18/2020  ? Right epiretinal membrane 11/18/2020  ? Pseudophakia, both eyes 11/18/2020  ? Essential hypertension 04/25/2018  ? Seasonal allergies 04/25/2018  ? Prediabetes 09/26/2017  ? Bilateral low back pain without sciatica 10/01/2014  ? Elevated blood-pressure reading without diagnosis of hypertension 10/01/2014  ? Allergic rhinitis 08/20/2014  ? Chronic edema 08/20/2014  ? Hyperthyroidism 09/26/2013  ? Other testicular hypofunction 09/26/2013  ? Erectile dysfunction 08/30/2013  ? Dyslipidemia 03/04/2013  ? Obesity, morbid ([place]) 03/04/2013  ? ? ?Past Medical History:  ?Diagnosis Date  ? Allergy   ? Arthritis   ? Hyperlipidemia   ? ? ?Past Surgical History:  ?Procedure Laterality Date  ? HIP SURGERY    ? KNEE SURGERY    ? SHOULDER SURGERY    ? ? ?Social History  ? ?Socioeconomic History  ? Marital status: Married  ?  Spouse name:  Not on file  ? Number of children: Not on file  ? Years of education: Not on file  ? Highest education level: Not on file  ?Occupational History  ? Occupation: retired  ?Tobacco Use  ? Smoking status: Former  ?  Packs/day: 0.25  ?  Years: 10.00  ?  Pack years: 2.50  ?  Types: Cigarettes  ?  Quit date: 01/19/1988  ?  Years since quitting: 33.3  ? Smokeless tobacco: Never  ?Substance and Sexual Activity  ? Alcohol use: No  ? Drug use: No  ? Sexual activity: Yes  ?Other Topics  Concern  ? Not on file  ?Social History Narrative  ? Divorced; Moved from [place] in 2014  ? Retired; has 1 son  ? ?Social Determinants of Health  ? ?Financial Resource Strain: Low Risk   ? Difficulty of Paying Living Expenses: Not hard at all  ?Food Insecurity: No Food Insecurity  ? Worried About Charity fundraiser in the Last Year: Never true  ? Ran Out of Food in the Last Year: Never true  ?Transportation Needs: No Transportation Needs  ? Lack of Transportation (Medical): No  ? Lack of Transportation (Non-Medical): No  ?Physical Activity: Sufficiently Active  ? Days of Exercise per Week: 5 days  ? Minutes of Exercise per Session: 30 min  ?Stress: No Stress Concern Present  ? Feeling of Stress : Not at all  ?Social Connections: Moderately Integrated  ? Frequency of Communication with Friends and Family: More than three times a week  ? Frequency of Social Gatherings with Friends and Family: Once a week  ? Attends Religious Services: More than 4 times per year  ? Active Member of Clubs or Organizations: No  ? Attends Archivist Meetings: More than 4 times per year  ? Marital Status: Divorced  ?Intimate Partner Violence: Not At Risk  ? Fear of Current or Ex-Partner: No  ? Emotionally Abused: No  ? Physically Abused: No  ? Sexually Abused: No  ? ? ?Family History  ?Problem Relation Age of Onset  ? Diabetes Mother   ? Stroke Father   ? Diabetes Sister   ? Thyroid disease Neg Hx   ? ? ? ?Review of Systems  ?Constitutional: Negative.  Negative for chills and fever.  ?HENT: Negative.  Negative for congestion.   ?Respiratory: Negative.  Negative for cough and shortness of breath.   ?Cardiovascular: Negative.  Negative for chest pain and palpitations.  ?Gastrointestinal:  Negative for abdominal pain, diarrhea, nausea and vomiting.  ?Genitourinary: Negative.   ?Musculoskeletal: Negative.   ?Skin: Negative.  Negative for rash.  ?Neurological:  Negative for dizziness and headaches.  ?All other systems reviewed and are  negative. ? ?Today's Vitals  ? 06/02/21 0811  ?BP: 134/72  ?Pulse: 85  ?Temp: 98.3 ?F (36.8 ?C)  ?TempSrc: Oral  ?SpO2: 94%  ?Weight: (!) 353 lb (160.1 kg)  ?Height: '6\' 2"'  (1.88 m)  ? ?Body mass index is 45.32 kg/m?. ? ?Physical Exam ?Constitutional:   ?   Appearance: Normal appearance. He is obese.  ?HENT:  ?   Head: Normocephalic.  ?Eyes:  ?   Extraocular Movements: Extraocular movements intact.  ?   Conjunctiva/sclera: Conjunctivae normal.  ?   Pupils: Pupils are equal, round, and reactive to light.  ?Cardiovascular:  ?   Rate and Rhythm: Normal rate and regular rhythm.  ?   Pulses: Normal pulses.  ?   Heart sounds: Normal heart sounds.  ?Pulmonary:  ?  Effort: Pulmonary effort is normal.  ?   Breath sounds: Normal breath sounds.  ?Abdominal:  ?   General: There is no distension.  ?   Palpations: Abdomen is soft.  ?   Tenderness: There is no abdominal tenderness.  ?Musculoskeletal:     ?   General: Normal range of motion.  ?   Cervical back: Normal range of motion and neck supple. No tenderness.  ?Lymphadenopathy:  ?   Cervical: No cervical adenopathy.  ?Skin: ?   General: Skin is warm and dry.  ?   Capillary Refill: Capillary refill takes less than 2 seconds.  ?Neurological:  ?   General: No focal deficit present.  ?   Mental Status: He is alert and oriented to person, place, and time.  ?Psychiatric:     ?   Mood and Affect: Mood normal.     ?   Behavior: Behavior normal.  ? ? ? ?ASSESSMENT & PLAN: ?A total of 47 minutes was spent with the patient and counseling/coordination of care regarding preparing for this visit, review of most recent office visit notes, review of most recent blood work results, review of all medications, education on nutrition, cardiovascular risks associated with hypertension and dyslipidemia and obesity, fall precautions, need for physical therapy, health maintenance items and need to screen for colon cancer with colonoscopy, prognosis, documentation and need for follow-up. ? ?Problem  List Items Addressed This Visit   ? ?  ? Cardiovascular and Mediastinum  ? Essential hypertension - Primary  ?  Well-controlled hypertension with normal blood pressure readings at home. ?Continue Hygroton 25 mg

## 2021-06-02 NOTE — Assessment & Plan Note (Signed)
Diet and nutrition discussed.  Stable. ?Continue simvastatin 80 mg daily. ?The 10-year ASCVD risk score (Arnett DK, et al., 2019) is: 19.2% ?  Values used to calculate the score: ?    Age: 65 years ?    Sex: Male ?    Is Non-Hispanic African American: Yes ?    Diabetic: No ?    Tobacco smoker: No ?    Systolic Blood Pressure: 202 mmHg ?    Is BP treated: Yes ?    HDL Cholesterol: 36.5 mg/dL ?    Total Cholesterol: 184 mg/dL ? ?

## 2021-06-02 NOTE — Assessment & Plan Note (Signed)
Well-controlled hypertension with normal blood pressure readings at home. ?Continue Hygroton 25 mg daily.  Blood work done today. ?Dietary approaches to control hypertension discussed. ?

## 2021-06-02 NOTE — Assessment & Plan Note (Signed)
Diet and nutrition discussed.  Advised to decrease amount of daily carbohydrate intake. ?Hemoglobin A1c done today. ?

## 2021-06-02 NOTE — Assessment & Plan Note (Signed)
Diet and nutrition discussed.  Advised to decrease daily caloric intake and decrease amount of daily carbohydrate intake. ?

## 2021-06-02 NOTE — Assessment & Plan Note (Signed)
At risk for falls.  May benefit from physical therapy.  Referral placed today. ?Fall precautions given. ?

## 2021-06-23 ENCOUNTER — Encounter: Payer: Self-pay | Admitting: Physical Therapy

## 2021-06-23 ENCOUNTER — Ambulatory Visit: Payer: Medicare HMO | Attending: Emergency Medicine | Admitting: Physical Therapy

## 2021-06-23 DIAGNOSIS — R600 Localized edema: Secondary | ICD-10-CM | POA: Diagnosis not present

## 2021-06-23 DIAGNOSIS — R2681 Unsteadiness on feet: Secondary | ICD-10-CM | POA: Diagnosis not present

## 2021-06-23 DIAGNOSIS — R278 Other lack of coordination: Secondary | ICD-10-CM | POA: Insufficient documentation

## 2021-06-23 DIAGNOSIS — M6281 Muscle weakness (generalized): Secondary | ICD-10-CM | POA: Insufficient documentation

## 2021-06-23 DIAGNOSIS — Z9181 History of falling: Secondary | ICD-10-CM | POA: Diagnosis not present

## 2021-06-23 DIAGNOSIS — R293 Abnormal posture: Secondary | ICD-10-CM | POA: Diagnosis not present

## 2021-06-23 DIAGNOSIS — R262 Difficulty in walking, not elsewhere classified: Secondary | ICD-10-CM | POA: Insufficient documentation

## 2021-06-23 NOTE — Therapy (Signed)
OUTPATIENT PHYSICAL THERAPY LOWER EXTREMITY EVALUATION   Patient Name: Nathan Fleming MRN: [id number] DOB:1955-03-04, 66 y.o., male Today's Date: 06/23/2021   PT End of Session - 06/23/21 0906     Visit Number 1    Date for PT Re-Evaluation 09/15/21    PT Start Time 0756    PT Stop Time 0841    PT Time Calculation (min) 45 min    Activity Tolerance Patient limited by fatigue;Patient tolerated treatment well    Behavior During Therapy [facility] for tasks assessed/performed             Past Medical History:  Diagnosis Date   Allergy    Arthritis    Hyperlipidemia    Past Surgical History:  Procedure Laterality Date   HIP SURGERY     KNEE SURGERY     SHOULDER SURGERY     Patient Active Problem List   Diagnosis Date Noted   Hypertensive retinopathy of both eyes 11/27/2020   Impaired ambulation 11/27/2020   Macular pucker, left eye 11/18/2020   Right epiretinal membrane 11/18/2020   Pseudophakia, both eyes 11/18/2020   Essential hypertension 04/25/2018   Seasonal allergies 04/25/2018   Prediabetes 09/26/2017   Bilateral low back pain without sciatica 10/01/2014   Elevated blood-pressure reading without diagnosis of hypertension 10/01/2014   Allergic rhinitis 08/20/2014   Chronic edema 08/20/2014   Hyperthyroidism 09/26/2013   Other testicular hypofunction 09/26/2013   Erectile dysfunction 08/30/2013   Dyslipidemia 03/04/2013   Obesity, morbid ([place]) 03/04/2013    PCP: Horald Pollen, MD   REFERRING PROVIDER: Horald Pollen, MD   REFERRING DIAG: Diagnosis R26.2 (ICD-10-CM) - Impaired ambulation Z91.81 (ICD-10-CM) - At risk for falls   THERAPY DIAG:  Difficulty in walking, not elsewhere classified  Localized edema  Muscle weakness (generalized)  Unsteadiness on feet  Other lack of coordination  Abnormal posture  Rationale for Evaluation and Treatment Rehabilitation  ONSET DATE: 06/02/2021   SUBJECTIVE:   SUBJECTIVE STATEMENT: Patient  reports that he has been less active lately, Dr is concerned about lack of balance and fall risk. He C/O R knee pain as well. He has had a R THR and L TKR. 2 weeks ago he did have an incident a couple weeks ago getting out of his shower when the mat slipped. His RLE slid to the side and it has been sore since then.  PERTINENT HISTORY: See PMHx  PAIN:  Are you having pain? No  PRECAUTIONS: None  WEIGHT BEARING RESTRICTIONS No  FALLS:  Has patient fallen in last 6 months? No  LIVING ENVIRONMENT: Lives with: lives alone Lives in: House/apartment Stairs: No Has following equipment at home: Single point cane and Grab bars  OCCUPATION: Retired  PLOF: Independent with household mobility with device  PATIENT GOALS : Improve balance, improve gait stability and distance.   OBJECTIVE:   COGNITION:  Overall cognitive status: Within functional limits for tasks assessed     SENSATION: Not tested   MUSCLE LENGTH: Hamstrings: Right 45 deg; Left 45 deg   POSTURE: rounded shoulders, forward head, and weight shift left  PALPATION: R knee TTP posterior.  LOWER EXTREMITY ROM: Mildly limited in all planes due to pain and or girth      LOWER EXTREMITY MMT:  MMT Right eval Left eval  Hip flexion 3- 3+  Hip extension 2+ 3-  Hip abduction    Hip adduction    Hip internal rotation    Hip external rotation    Knee  flexion 3+ 3+  Knee extension 3+ 3+  Ankle dorsiflexion 4- 4-  Ankle plantarflexion    Ankle inversion    Ankle eversion     (Blank rows = not tested)  TRANSFERS: Patient had great difficulty standing from regular height chair, required BUE support and extra time.  FUNCTIONAL TESTS:  Timed up and go (TUG): 35.10 3 minute walk test: Patient unable to attempt due to fear of not being able to complete.   GAIT: Distance walked: 5' Assistive device utilized: Single point cane Level of assistance: Modified independence Comments: Antalgic on R, step to pattern,  very unsteady and very slow.    TODAY'S TREATMENT: Patient education Seated LE-long kicks, march, reaching heel/toe, abd against Red Tband 5-10 reps each   PATIENT EDUCATION:  Education details: POC, HEP Person educated: Patient Education method: Consulting civil engineer, Demonstration, and Handouts Education comprehension: verbalized understanding and returned demonstration   HOME EXERCISE PROGRAM:  [id number]  ASSESSMENT:  CLINICAL IMPRESSION: Patient is a 66 y.o. who was seen today for physical therapy evaluation and treatment for weakness, decreased balance, increased fall risk. He reports new onset of R knee pain after slipping in shower, did not fall. He reports that the knee feels better when he moves it. He has difficulty with transfers and gait. He will benefit from PT for strengthening, balance training, overall activity tolerance training. He does have a treadmill at home, encouraged to walk daily as part of his recovery.   OBJECTIVE IMPAIRMENTS Abnormal gait, decreased activity tolerance, decreased balance, decreased coordination, decreased endurance, decreased mobility, difficulty walking, decreased ROM, decreased strength, impaired flexibility, improper body mechanics, postural dysfunction, and pain.   ACTIVITY LIMITATIONS carrying, lifting, bending, standing, squatting, stairs, transfers, bed mobility, and locomotion level  PARTICIPATION LIMITATIONS: cleaning, driving, shopping, community activity, and yard work  PERSONAL FACTORS Behavior pattern and Past/current experiences are also affecting patient's functional outcome.   REHAB POTENTIAL: Good  CLINICAL DECISION MAKING: Evolving/moderate complexity  EVALUATION COMPLEXITY: Low   GOALS: Goals reviewed with patient? Yes  SHORT TERM GOALS: Target date: 07/21/2021   I with basic HEP Baseline: initiated. Goal status: INITIAL  2.  Perform TUG in < 28 seconds Baseline: 35.10 Goal status: INITIAL  3.  Patient will  complete 3 minute walk test to establish baseline. Baseline: Declined due to fear of not being able to complete. Goal status: INITIAL    LONG TERM GOALS: Target date: 09/15/2021   I with final HEP Baseline:  Goal status: INITIAL  2.  Complete TUG in < 20 seconds to demonstrate improved balance. Baseline: 35.10 Goal status: INITIAL  3.  Ambulate at least 300' in 3 minute walk test without excessive strain. Baseline: Unable Goal status: INITIAL  4.  Patient will perform sit to stand from mod height chair MI, minimal to no UE support required. Baseline: Max UE support and increased time required. Goal status: INITIAL  5.  Increase BLE strength by at least 1 full muscle grade throughout. Baseline: (3-)-3+/5 Goal status: INITIAL   PLAN: PT FREQUENCY: 1x/week  PT DURATION: 12 weeks  PLANNED INTERVENTIONS: Therapeutic exercises, Therapeutic activity, Neuromuscular re-education, Balance training, Gait training, Patient/Family education, Joint mobilization, Dry Needling, Electrical stimulation, Cryotherapy, Moist heat, Vasopneumatic device, Ionotophoresis '4mg'$ /ml Dexamethasone, and Manual therapy  PLAN FOR NEXT SESSION: Assess [place] to HEP, add trunk stabilization/postural exercises and abductor exercises, progress with strength and balance training.   Marcelina Morel, DPT 06/23/2021, 9:08 AM

## 2021-06-26 ENCOUNTER — Other Ambulatory Visit: Payer: Self-pay | Admitting: Emergency Medicine

## 2021-06-26 DIAGNOSIS — E785 Hyperlipidemia, unspecified: Secondary | ICD-10-CM

## 2021-06-30 ENCOUNTER — Encounter: Payer: Self-pay | Admitting: Physical Therapy

## 2021-06-30 ENCOUNTER — Ambulatory Visit: Payer: Medicare HMO | Admitting: Physical Therapy

## 2021-06-30 DIAGNOSIS — R293 Abnormal posture: Secondary | ICD-10-CM

## 2021-06-30 DIAGNOSIS — M6281 Muscle weakness (generalized): Secondary | ICD-10-CM | POA: Diagnosis not present

## 2021-06-30 DIAGNOSIS — R2681 Unsteadiness on feet: Secondary | ICD-10-CM | POA: Diagnosis not present

## 2021-06-30 DIAGNOSIS — R278 Other lack of coordination: Secondary | ICD-10-CM | POA: Diagnosis not present

## 2021-06-30 DIAGNOSIS — R600 Localized edema: Secondary | ICD-10-CM | POA: Diagnosis not present

## 2021-06-30 DIAGNOSIS — R262 Difficulty in walking, not elsewhere classified: Secondary | ICD-10-CM

## 2021-06-30 DIAGNOSIS — Z9181 History of falling: Secondary | ICD-10-CM | POA: Diagnosis not present

## 2021-06-30 NOTE — Therapy (Signed)
OUTPATIENT PHYSICAL THERAPY LOWER EXTREMITY EVALUATION   Patient Name: Nathan Fleming MRN: [id number] DOB:[birth year], 66 y.o., male Today's Date: 06/30/2021     Past Medical History:  Diagnosis Date   Allergy    Arthritis    Hyperlipidemia    Past Surgical History:  Procedure Laterality Date   HIP SURGERY     KNEE SURGERY     SHOULDER SURGERY     Patient Active Problem List   Diagnosis Date Noted   Hypertensive retinopathy of both eyes 11/27/2020   Impaired ambulation 11/27/2020   Macular pucker, left eye 11/18/2020   Right epiretinal membrane 11/18/2020   Pseudophakia, both eyes 11/18/2020   Essential hypertension 04/25/2018   Seasonal allergies 04/25/2018   Prediabetes 09/26/2017   Bilateral low back pain without sciatica 10/01/2014   Elevated blood-pressure reading without diagnosis of hypertension 10/01/2014   Allergic rhinitis 08/20/2014   Chronic edema 08/20/2014   Hyperthyroidism 09/26/2013   Other testicular hypofunction 09/26/2013   Erectile dysfunction 08/30/2013   Dyslipidemia 03/04/2013   Obesity, morbid ([place]) 03/04/2013    PCP: Horald Pollen, MD   REFERRING PROVIDER: Horald Pollen, MD   REFERRING DIAG: Diagnosis R26.2 (ICD-10-CM) - Impaired ambulation Z91.81 (ICD-10-CM) - At risk for falls   THERAPY DIAG:  No diagnosis found.  Rationale for Evaluation and Treatment Rehabilitation  ONSET DATE: 06/02/2021   SUBJECTIVE:   SUBJECTIVE STATEMENT: Patient reports that he has been performing HEP and pm, finds it fairly challenging. He has been walking on the treadmill 20 minutes, 4 days last week. He notes he is feeling better with less pain in the knee.  PERTINENT HISTORY: See PMHx  PAIN:  Are you having pain? No  PRECAUTIONS: None  WEIGHT BEARING RESTRICTIONS No  FALLS:  Has patient fallen in last 6 months? No  LIVING ENVIRONMENT: Lives with: lives alone Lives in: House/apartment Stairs: No Has following equipment at  home: Single point cane and Grab bars  OCCUPATION: Retired  PLOF: Independent with household mobility with device  PATIENT GOALS : Improve balance, improve gait stability and distance.   OBJECTIVE:   COGNITION:  Overall cognitive status: Within functional limits for tasks assessed     SENSATION: Not tested   MUSCLE LENGTH: Hamstrings: Right 45 deg; Left 45 deg   POSTURE: rounded shoulders, forward head, and weight shift left  PALPATION: R knee TTP posterior.  LOWER EXTREMITY ROM: Mildly limited in all planes due to pain and or girth      LOWER EXTREMITY MMT:  MMT Right eval Left eval  Hip flexion 3- 3+  Hip extension 2+ 3-  Hip abduction    Hip adduction    Hip internal rotation    Hip external rotation    Knee flexion 3+ 3+  Knee extension 3+ 3+  Ankle dorsiflexion 4- 4-  Ankle plantarflexion    Ankle inversion    Ankle eversion     (Blank rows = not tested)  TRANSFERS: Patient had great difficulty standing from regular height chair, required BUE support and extra time.  FUNCTIONAL TESTS:  Timed up and go (TUG): 35.10 3 minute walk test: Patient unable to attempt due to fear of not being able to complete.   GAIT: Distance walked: 15' Assistive device utilized: Single point cane Level of assistance: Modified independence Comments: Antalgic on R, step to pattern, very unsteady and very slow.    TODAY'S TREATMENT:  06/30/21 Nu-Step L4-5 x 5 minutes. Seated turn to R, walk hands away from trunk  and back, 3 reps to each side. Sit <> stand from elevated mat, x 5 reps Lateral lunge x 5 to each side, holding 3 pound weight in BUE to activate trunk. (R knee painful) Standing heel raises 2 x 10 reps Alternate step taps on 2", 8 reps each, with cane, fatigued, increased difficulty with RLE. Gait with cane, placed in LUE, 50', imporved stability noted.   Evaluation Patient education Seated LE-long kicks, march, reaching heel/toe, abd against Red Tband  5-10 reps each   PATIENT EDUCATION:  Education details: POC, HEP Person educated: Patient Education method: Consulting civil engineer, Demonstration, and Handouts Education comprehension: verbalized understanding and returned demonstration   HOME EXERCISE PROGRAM:  [id number]  ASSESSMENT:  CLINICAL IMPRESSION: Patient demonstrated improved strength in BLE, improved ability to stand from lower surface, did not use UE. Increased challenge today.   OBJECTIVE IMPAIRMENTS Abnormal gait, decreased activity tolerance, decreased balance, decreased coordination, decreased endurance, decreased mobility, difficulty walking, decreased ROM, decreased strength, impaired flexibility, improper body mechanics, postural dysfunction, and pain.   ACTIVITY LIMITATIONS carrying, lifting, bending, standing, squatting, stairs, transfers, bed mobility, and locomotion level  PARTICIPATION LIMITATIONS: cleaning, driving, shopping, community activity, and yard work  PERSONAL FACTORS Behavior pattern and Past/current experiences are also affecting patient's functional outcome.   REHAB POTENTIAL: Good  CLINICAL DECISION MAKING: Evolving/moderate complexity  EVALUATION COMPLEXITY: Low   GOALS: Goals reviewed with patient? Yes  SHORT TERM GOALS: Target date: 07/21/2021   I with basic HEP Baseline: updated Goal status: ongoing   2.  Perform TUG in < 28 seconds Baseline: 35.10 Goal status: INITIAL  3.  Patient will complete 3 minute walk test to establish baseline. Baseline: Declined due to fear of not being able to complete. Goal status: INITIAL    LONG TERM GOALS: Target date: 09/15/2021   I with final HEP Baseline:  Goal status: INITIAL  2.  Complete TUG in < 20 seconds to demonstrate improved balance. Baseline: 35.10 Goal status: INITIAL  3.  Ambulate at least 300' in 3 minute walk test without excessive strain. Baseline: Unable Goal status: INITIAL  4.  Patient will perform sit to stand from mod  height chair MI, minimal to no UE support required. Baseline: Max UE support and increased time required. Goal status: INITIAL  5.  Increase BLE strength by at least 1 full muscle grade throughout. Baseline: (3-)-3+/5 Goal status: INITIAL   PLAN: PT FREQUENCY: 1x/week  PT DURATION: 12 weeks  PLANNED INTERVENTIONS: Therapeutic exercises, Therapeutic activity, Neuromuscular re-education, Balance training, Gait training, Patient/Family education, Joint mobilization, Dry Needling, Electrical stimulation, Cryotherapy, Moist heat, Vasopneumatic device, Ionotophoresis '4mg'$ /ml Dexamethasone, and Manual therapy  PLAN FOR NEXT SESSION: continue to progress strengthening.   Marcelina Morel, DPT 06/30/2021, 8:04 AM

## 2021-07-14 ENCOUNTER — Ambulatory Visit: Payer: Medicare HMO | Admitting: Physical Therapy

## 2021-07-22 ENCOUNTER — Other Ambulatory Visit: Payer: Self-pay | Admitting: Emergency Medicine

## 2021-07-22 DIAGNOSIS — E785 Hyperlipidemia, unspecified: Secondary | ICD-10-CM

## 2021-07-29 ENCOUNTER — Ambulatory Visit (INDEPENDENT_AMBULATORY_CARE_PROVIDER_SITE_OTHER): Payer: Medicare HMO

## 2021-07-29 ENCOUNTER — Other Ambulatory Visit: Payer: Self-pay

## 2021-07-29 ENCOUNTER — Telehealth: Payer: Self-pay

## 2021-07-29 DIAGNOSIS — Z0000 Encounter for general adult medical examination without abnormal findings: Secondary | ICD-10-CM | POA: Diagnosis not present

## 2021-07-29 DIAGNOSIS — Z1211 Encounter for screening for malignant neoplasm of colon: Secondary | ICD-10-CM | POA: Diagnosis not present

## 2021-07-29 DIAGNOSIS — I10 Essential (primary) hypertension: Secondary | ICD-10-CM

## 2021-07-29 NOTE — Telephone Encounter (Signed)
   Telephone encounter was:  Unsuccessful.  07/29/2021 Name: Nathan Fleming MRN: [id number] DOB: Jul 26, 1955  Unsuccessful outbound call made today to assist with:  Transportation Needs , Food Insecurity, and Financial Difficulties related to financial strain  Outreach Attempt:  1st Attempt  A HIPAA compliant voice message was left requesting a return call.  Instructed patient to call back at earliest convenience.  [facility], Care Management  [phone] [address], [place], [place] [address] Phone: [phone] Email: [email or web address]

## 2021-07-29 NOTE — Progress Notes (Signed)
I connected with Nathan Fleming today by telephone and verified that I am speaking with the correct person using two identifiers. Location patient: home Location provider: work Persons participating in the virtual visit: patient, provider.   I discussed the limitations, risks, security and privacy concerns of performing an evaluation and management service by telephone and the availability of in person appointments. I also discussed with the patient that there may be a patient responsible charge related to this service. The patient expressed understanding and verbally consented to this telephonic visit.    Interactive audio and video telecommunications were attempted between this provider and patient, however failed, due to patient having technical difficulties OR patient did not have access to video capability.  We continued and completed visit with audio only.  Some vital signs may be absent or patient reported.   Time Spent with patient on telephone encounter: 30 minutes  Subjective:   Nathan Fleming is a 66 y.o. male who presents for Medicare Annual/Subsequent preventive examination.  Review of Systems     Cardiac Risk Factors include: advanced age (>76mn, >>19women);dyslipidemia;family history of premature cardiovascular disease;hypertension;male gender;obesity (BMI >30kg/m2)     Objective:    There were no vitals filed for this visit. There is no height or weight on file to calculate BMI.     07/29/2021    9:50 AM 07/24/2020    8:28 AM 10/11/2013    7:19 PM  Advanced Directives  Does Patient Have a Medical Advance Directive? No No No  Would patient like information on creating a medical advance directive? No - Patient declined Yes (MAU/Ambulatory/Procedural Areas - Information given) No - patient declined information    Current Medications (verified) Outpatient Encounter Medications as of 07/29/2021  Medication Sig   albuterol (VENTOLIN HFA) 108 (90 Base) MCG/ACT inhaler  Inhale 2 puffs into the lungs every 6 (six) hours as needed for wheezing or shortness of breath.   Blood Pressure KIT Take blood pressure reading once a day at different times for the next several weeks.   Blood Pressure Monitoring (BLOOD PRESSURE MONITOR/WRIST) DEVI 1 Device by Does not apply route daily.   cetirizine (ZYRTEC) 10 MG tablet Take 1 tablet (10 mg total) by mouth daily.   chlorthalidone (HYGROTON) 25 MG tablet TAKE 1/2 TABLET BY MOUTH EVERY DAY   clotrimazole-betamethasone (LOTRISONE) cream Apply 1 application topically 2 (two) times daily.   Menthol, Topical Analgesic, (BIOFREEZE EX) Apply 1 application topically daily as needed (knee pain).   sildenafil (VIAGRA) 100 MG tablet Take 0.5-1 tablets (50-100 mg total) by mouth daily as needed for erectile dysfunction.   simvastatin (ZOCOR) 80 MG tablet TAKE 1 TABLET BY MOUTH EVERY DAY AT 6 PM   triamcinolone (NASACORT) 55 MCG/ACT AERO nasal inhaler Place 2 sprays into the nose daily.   No facility-administered encounter medications on file as of 07/29/2021.    Allergies (verified) Patient has no known allergies.   History: Past Medical History:  Diagnosis Date   Allergy    Arthritis    Hyperlipidemia    Past Surgical History:  Procedure Laterality Date   HIP SURGERY     KNEE SURGERY     SHOULDER SURGERY     Family History  Problem Relation Age of Onset   Diabetes Mother    Stroke Father    Diabetes Sister    Thyroid disease Neg Hx    Social History   Socioeconomic History   Marital status: Married    Spouse name: Not on  file   Number of children: Not on file   Years of education: Not on file   Highest education level: Not on file  Occupational History   Occupation: retired  Tobacco Use   Smoking status: Former    Packs/day: 0.25    Years: 10.00    Total pack years: 2.50    Types: Cigarettes    Quit date: 01/19/1988    Years since quitting: 33.5   Smokeless tobacco: Never  Substance and Sexual Activity    Alcohol use: No   Drug use: No   Sexual activity: Yes  Other Topics Concern   Not on file  Social History Narrative   Divorced; Moved from [place] in 2014   Retired; has 1 son   Social Determinants of Radio broadcast assistant Strain: High Risk (07/29/2021)   Overall Financial Resource Strain (CARDIA)    Difficulty of Paying Living Expenses: Very hard  Food Insecurity: No Food Insecurity (07/29/2021)   Hunger Vital Sign    Worried About Running Out of Food in the Last Year: Never true    [place] in the Last Year: Never true  Transportation Needs: Unmet Transportation Needs (07/29/2021)   PRAPARE - Transportation    Lack of Transportation (Medical): Yes    Lack of Transportation (Non-Medical): Yes  Physical Activity: Sufficiently Active (07/29/2021)   Exercise Vital Sign    Days of Exercise per Week: 7 days    Minutes of Exercise per Session: 30 min  Stress: No Stress Concern Present (07/29/2021)   [place] of Stress : Not at all  Social Connections: Moderately Integrated (07/29/2021)   Social Connection and Isolation Panel [NHANES]    Frequency of Communication with Friends and Family: More than three times a week    Frequency of Social Gatherings with Friends and Family: Once a week    Attends Religious Services: More than 4 times per year    Active Member of [employer] or Organizations: No    Attends Music therapist: More than 4 times per year    Marital Status: Divorced    Tobacco Counseling Counseling given: Not Answered   Clinical Intake:  Pre-visit preparation completed: Yes  Pain : No/denies pain     BMI - recorded: 45.32 Nutritional Status: BMI > 30  Obese Nutritional Risks: None Diabetes: No  How often do you need to have someone help you when you read instructions, pamphlets, or other written materials from your doctor or pharmacy?: 1 - Never What is the last grade  level you completed in school?: HSG  Diabetic? no  Interpreter Needed?: No  Information entered by :: Lisette Abu, LPN.   Activities of Daily Living    07/29/2021   10:06 AM  In your present state of health, do you have any difficulty performing the following activities:  Hearing? 0  Vision? 0  Difficulty concentrating or making decisions? 0  Walking or climbing stairs? 0  Dressing or bathing? 0  Doing errands, shopping? 1  Comment transportation issues  Preparing Food and eating ? N  Using the Toilet? N  In the past six months, have you accidently leaked urine? N  Do you have problems with loss of bowel control? N  Managing your Medications? N  Managing your Finances? N  Housekeeping or managing your Housekeeping? N    Patient Care Team: Horald Pollen, MD as PCP - General (Internal  Medicine) [place], Antionette Fairy, MD as Consulting Physician (Ophthalmology)  Indicate any recent Medical Services you may have received from other than [facility] providers in the past year (date may be approximate).     Assessment:   This is a routine wellness examination for Nathan Fleming.  Hearing/Vision screen Hearing Screening - Comments:: Patient denied any hearing difficulty.   No hearing aids.  Vision Screening - Comments:: Patient does wear readers.  Eye exam done by: Dr. Olene Craven   Dietary issues and exercise activities discussed: Current Exercise Habits: Home exercise routine, Type of exercise: treadmill;Other - see comments (PT exercises), Time (Minutes): 30, Frequency (Times/Week): 7, Weekly Exercise (Minutes/Week): 210, Intensity: Moderate, Exercise limited by: orthopedic condition(s)   Goals Addressed   None   Depression Screen    07/29/2021    9:54 AM 06/02/2021    8:11 AM 11/27/2020   10:16 AM 07/24/2020    8:33 AM 04/22/2020   11:29 AM 02/13/2019    9:36 AM 01/22/2019    5:08 PM  PHQ 2/9 Scores  PHQ - 2 Score 0 0 0 0 0 0 0    Fall Risk    07/29/2021    9:51  AM 06/02/2021    8:11 AM 11/27/2020   10:17 AM 07/24/2020    8:29 AM 04/22/2020   11:29 AM  Fall Risk   Falls in the past year? 1 1 0 0 0  Number falls in past yr: 0 0 0 0 0  Injury with Fall? 1 1 0 0   Risk for fall due to :   Impaired balance/gait Impaired balance/gait No Fall Risks  Follow up Falls prevention discussed   Falls evaluation completed Falls evaluation completed    FALL RISK PREVENTION PERTAINING TO THE HOME:  Any stairs in or around the home? Yes  If so, are there any without handrails? No  Home free of loose throw rugs in walkways, pet beds, electrical cords, etc? Yes  Adequate lighting in your home to reduce risk of falls? Yes   ASSISTIVE DEVICES UTILIZED TO PREVENT FALLS:  Life alert? No  Use of a cane, walker or w/c? No  Grab bars in the bathroom? Yes  Shower chair or bench in shower? No  Elevated toilet seat or a handicapped toilet? Yes   TIMED UP AND GO:  Was the test performed? No .  Length of time to ambulate 10 feet: n/a sec.   Appearance of gait: Gait not evaluated during this visit.  Cognitive Function:        07/29/2021   10:07 AM  6CIT Screen  What Year? 0 points  What month? 0 points  What time? 0 points  Count back from 20 0 points  Months in reverse 0 points  Repeat phrase 0 points  Total Score 0 points    Immunizations Immunization History  Administered Date(s) Administered   Fluad Quad(high Dose 65+) 11/27/2020   Influenza,inj,Quad PF,6+ Mos 03/02/2013, 10/01/2014, 01/03/2016, 09/10/2016, 12/29/2017, 10/14/2018   Influenza-Unspecified 10/31/2019   Janssen (J&J) SARS-COV-2 Vaccination 04/06/2019   PFIZER(Purple Top)SARS-COV-2 Vaccination 01/01/2020   Pneumococcal Conjugate-13 04/22/2020   Tdap 09/10/2016    TDAP status: Up to date  Flu Vaccine status: Up to date  Pneumococcal vaccine status: Due, Education has been provided regarding the importance of this vaccine. Advised may receive this vaccine at local pharmacy or  Health Dept. Aware to provide a copy of the vaccination record if obtained from local pharmacy or Health Dept. Verbalized acceptance and understanding.  Covid-19 vaccine status: Completed vaccines  Qualifies for Shingles Vaccine? Yes   Zostavax completed No   Shingrix Completed?: No.    Education has been provided regarding the importance of this vaccine. Patient has been advised to call insurance company to determine out of pocket expense if they have not yet received this vaccine. Advised may also receive vaccine at local pharmacy or Health Dept. Verbalized acceptance and understanding.  Screening Tests Health Maintenance  Topic Date Due   COLONOSCOPY (Pts 45-38yr Insurance coverage will need to be confirmed)  Never done   Hepatitis C Screening  Never done   Zoster Vaccines- Shingrix (1 of 2) Never done   COVID-19 Vaccine (3 - Booster for Janssen series) 02/26/2020   Pneumonia Vaccine 67 Years old (2 - PPSV23 or PCV20) 04/22/2021   INFLUENZA VACCINE  08/18/2021   TETANUS/TDAP  09/11/2026   HPV VACCINES  Aged Out    Health Maintenance  Health Maintenance Due  Topic Date Due   COLONOSCOPY (Pts 45-495yrInsurance coverage will need to be confirmed)  Never done   Hepatitis C Screening  Never done   Zoster Vaccines- Shingrix (1 of 2) Never done   COVID-19 Vaccine (3 - Booster for Janssen series) 02/26/2020   Pneumonia Vaccine 6576Years old (2 - PPSV23 or PCV20) 04/22/2021   Colorectal cancer screening: Order placed for Cologuard 07/29/2021  Lung Cancer Screening: (Low Dose CT Chest recommended if Age 67-80ears, 30 pack-year currently smoking OR have quit w/in 15years.) does not qualify.   Lung Cancer Screening Referral: no  Additional Screening:  Hepatitis C Screening: does qualify; Completed: no  Vision Screening: Recommended annual ophthalmology exams for early detection of glaucoma and other disorders of the eye. Is the patient up to date with their annual eye exam?  Yes   Who is the provider or what is the name of the office in which the patient attends annual eye exams? Dr. DeOlene Cravenf pt is not established with a provider, would they like to be referred to a provider to establish care? No .   Dental Screening: Recommended annual dental exams for proper oral hygiene  Community Resource Referral / Chronic Care Management: CRR required this visit?  Yes   CCM required this visit?  Yes      Plan:     I have personally reviewed and noted the following in the patient's chart:   Medical and social history Use of alcohol, tobacco or illicit drugs  Current medications and supplements including opioid prescriptions. Patient is not currently taking opioid prescriptions. Functional ability and status Nutritional status Physical activity Advanced directives List of other physicians Hospitalizations, surgeries, and ER visits in previous 12 months Vitals Screenings to include cognitive, depression, and falls Referrals and appointments  In addition, I have reviewed and discussed with patient certain preventive protocols, quality metrics, and best practice recommendations. A written personalized care plan for preventive services as well as general preventive health recommendations were provided to patient.     ShSheral FlowLPN   7/0/26/3785 Nurse Notes:  Patient is cogitatively intact. There were no vitals filed for this visit. There is no height or weight on file to calculate BMI. [id number] placed for: Transportation needs and [employer] food.

## 2021-07-29 NOTE — Patient Instructions (Signed)
Nathan Fleming , Thank you for taking time to come for your Medicare Wellness Visit. I appreciate your ongoing commitment to your health goals. Please review the following plan we discussed and let me know if I can assist you in the future.   Screening recommendations/referrals: Cologuard: Order placed 07/30/2021 Recommended yearly ophthalmology/optometry visit for glaucoma screening and checkup Recommended yearly dental visit for hygiene and checkup  Vaccinations: Influenza vaccine: 11/27/2020 Pneumococcal vaccine: 04/22/2020; due for Prevnar 20 (can check with local pharmacy) Tdap vaccine: 09/10/2016; due every 10 years Shingles vaccine: never done (check with local pharmacy for vaccine and cost)   Covid-19: 04/05/2199, 01/01/2020  Advanced directives: No; Advance directive discussed with you today. I have provided a copy for you to complete at home and have notarized. Once this is complete please bring a copy in to our office so we can scan it into your chart.  Conditions/risks identified: Yes  Next appointment: Please schedule your next Medicare Wellness Visit with your Nurse Health Advisor in 1 year by calling [phone].  Preventive Care 67 Years and Older, Male Preventive care refers to lifestyle choices and visits with your health care provider that can promote health and wellness. What does preventive care include? A yearly physical exam. This is also called an annual well check. Dental exams once or twice a year. Routine eye exams. Ask your health care provider how often you should have your eyes checked. Personal lifestyle choices, including: Daily care of your teeth and gums. Regular physical activity. Eating a healthy diet. Avoiding tobacco and drug use. Limiting alcohol use. Practicing safe sex. Taking low doses of aspirin every day. Taking vitamin and mineral supplements as recommended by your health care provider. What happens during an annual well check? The services and  screenings done by your health care provider during your annual well check will depend on your age, overall health, lifestyle risk factors, and family history of disease. Counseling  Your health care provider may ask you questions about your: Alcohol use. Tobacco use. Drug use. Emotional well-being. Home and relationship well-being. Sexual activity. Eating habits. History of falls. Memory and ability to understand (cognition). Work and work Statistician. Screening  You may have the following tests or measurements: Height, weight, and BMI. Blood pressure. Lipid and cholesterol levels. These may be checked every 5 years, or more frequently if you are over 5 years old. Skin check. Lung cancer screening. You may have this screening every year starting at age 69 if you have a 30-pack-year history of smoking and currently smoke or have quit within the past 15 years. Fecal occult blood test (FOBT) of the stool. You may have this test every year starting at age 36. Flexible sigmoidoscopy or colonoscopy. You may have a sigmoidoscopy every 5 years or a colonoscopy every 10 years starting at age 68. Prostate cancer screening. Recommendations will vary depending on your family history and other risks. Hepatitis C blood test. Hepatitis B blood test. Sexually transmitted disease (STD) testing. Diabetes screening. This is done by checking your blood sugar (glucose) after you have not eaten for a while (fasting). You may have this done every 1-3 years. Abdominal aortic aneurysm (AAA) screening. You may need this if you are a current or former smoker. Osteoporosis. You may be screened starting at age 61 if you are at high risk. Talk with your health care provider about your test results, treatment options, and if necessary, the need for more tests. Vaccines  Your health care provider may recommend  certain vaccines, such as: Influenza vaccine. This is recommended every year. Tetanus, diphtheria, and  acellular pertussis (Tdap, Td) vaccine. You may need a Td booster every 10 years. Zoster vaccine. You may need this after age 72. Pneumococcal 13-valent conjugate (PCV13) vaccine. One dose is recommended after age 83. Pneumococcal polysaccharide (PPSV23) vaccine. One dose is recommended after age 61. Talk to your health care provider about which screenings and vaccines you need and how often you need them. This information is not intended to replace advice given to you by your health care provider. Make sure you discuss any questions you have with your health care provider. Document Released: 01/31/2015 Document Revised: 09/24/2015 Document Reviewed: 11/05/2014 Elsevier Interactive Patient Education  2017 [place] Prevention in the Home Falls can cause injuries. They can happen to people of all ages. There are many things you can do to make your home safe and to help prevent falls. What can I do on the outside of my home? Regularly fix the edges of walkways and driveways and fix any cracks. Remove anything that might make you trip as you walk through a door, such as a raised step or threshold. Trim any bushes or trees on the path to your home. Use bright outdoor lighting. Clear any walking paths of anything that might make someone trip, such as rocks or tools. Regularly check to see if handrails are loose or broken. Make sure that both sides of any steps have handrails. Any raised decks and porches should have guardrails on the edges. Have any leaves, snow, or ice cleared regularly. Use sand or salt on walking paths during winter. Clean up any spills in your garage right away. This includes oil or grease spills. What can I do in the bathroom? Use night lights. Install grab bars by the toilet and in the tub and shower. Do not use towel bars as grab bars. Use non-skid mats or decals in the tub or shower. If you need to sit down in the shower, use a plastic, non-slip stool. Keep  the floor dry. Clean up any water that spills on the floor as soon as it happens. Remove soap buildup in the tub or shower regularly. Attach bath mats securely with double-sided non-slip rug tape. Do not have throw rugs and other things on the floor that can make you trip. What can I do in the bedroom? Use night lights. Make sure that you have a light by your bed that is easy to reach. Do not use any sheets or blankets that are too big for your bed. They should not hang down onto the floor. Have a firm chair that has side arms. You can use this for support while you get dressed. Do not have throw rugs and other things on the floor that can make you trip. What can I do in the kitchen? Clean up any spills right away. Avoid walking on wet floors. Keep items that you use a lot in easy-to-reach places. If you need to reach something above you, use a strong step stool that has a grab bar. Keep electrical cords out of the way. Do not use floor polish or wax that makes floors slippery. If you must use wax, use non-skid floor wax. Do not have throw rugs and other things on the floor that can make you trip. What can I do with my stairs? Do not leave any items on the stairs. Make sure that there are handrails on both sides of the  stairs and use them. Fix handrails that are broken or loose. Make sure that handrails are as long as the stairways. Check any carpeting to make sure that it is firmly attached to the stairs. Fix any carpet that is loose or worn. Avoid having throw rugs at the top or bottom of the stairs. If you do have throw rugs, attach them to the floor with carpet tape. Make sure that you have a light switch at the top of the stairs and the bottom of the stairs. If you do not have them, ask someone to add them for you. What else can I do to help prevent falls? Wear shoes that: Do not have high heels. Have rubber bottoms. Are comfortable and fit you well. Are closed at the toe. Do not  wear sandals. If you use a stepladder: Make sure that it is fully opened. Do not climb a closed stepladder. Make sure that both sides of the stepladder are locked into place. Ask someone to hold it for you, if possible. Clearly mark and make sure that you can see: Any grab bars or handrails. First and last steps. Where the edge of each step is. Use tools that help you move around (mobility aids) if they are needed. These include: Canes. Walkers. Scooters. Crutches. Turn on the lights when you go into a dark area. Replace any light bulbs as soon as they burn out. Set up your furniture so you have a clear path. Avoid moving your furniture around. If any of your floors are uneven, fix them. If there are any pets around you, be aware of where they are. Review your medicines with your doctor. Some medicines can make you feel dizzy. This can increase your chance of falling. Ask your doctor what other things that you can do to help prevent falls. This information is not intended to replace advice given to you by your health care provider. Make sure you discuss any questions you have with your health care provider. Document Released: 10/31/2008 Document Revised: 06/12/2015 Document Reviewed: 02/08/2014 Elsevier Interactive Patient Education  2017 [employer].

## 2021-07-31 ENCOUNTER — Telehealth: Payer: Self-pay

## 2021-07-31 NOTE — Telephone Encounter (Signed)
   Telephone encounter was:  Successful.  07/31/2021 Name: Nathan Fleming MRN: [id number] DOB: 19-Sep-1955  Nathan Fleming is a 66 y.o. year old male who is a primary care patient of Sagardia, Ines Bloomer, MD . The community resource team was consulted for assistance with Transportation Needs , Food Insecurity, Home Modifications, and Financial Difficulties related to financial strain  Care guide performed the following interventions: Patient provided with information about care guide support team and interviewed to confirm resource needs.Patient stated he needs help with transportation food financial and shower modifcations in which I gave independant living resources over the phone and I will call back to make calls for transportation with patient  Follow Up Plan:  Care guide will follow up with patient by phone over the next two weeks   [facility]  [phone] [address], [place], [place] [address] Phone: [phone] Email: [email or web address]

## 2021-08-03 ENCOUNTER — Telehealth: Payer: Self-pay

## 2021-08-03 NOTE — Telephone Encounter (Signed)
Mailed resources   [facility], [facility]  [phone] [address], [place], [place] [address] Phone: [phone] Email: [email or web address]

## 2021-08-11 ENCOUNTER — Telehealth: Payer: Self-pay

## 2021-08-11 NOTE — Telephone Encounter (Signed)
   Telephone encounter was:  Unsuccessful.  08/11/2021 Name: Nathan Fleming MRN: [id number] DOB: May 08, 1955  Unsuccessful outbound call made today to assist with:  Food Insecurity  Outreach Attempt:  3rd Attempt.  Referral closed unable to contact patient.     [facility], [facility]  [phone] [address], [place], [place] [address] Phone: [phone] Email: [email or web address]

## 2021-08-17 ENCOUNTER — Telehealth: Payer: Self-pay

## 2021-08-17 NOTE — Telephone Encounter (Signed)
   Telephone encounter was:  Successful.  08/17/2021 Name: Nathan Fleming MRN: [id number] DOB: August 07, 1955  Nathan Fleming is a 66 y.o. year old male who is a primary care patient of Sagardia, Ines Bloomer, MD . The community resource team was consulted for assistance with Transportation Needs , Food Insecurity, and Home Modifications  Care guide performed the following interventions: Patient provided with information about care guide support team and interviewed to confirm resource needs.Follow up for mailed resources, the Patient did receive the resources   Follow Up Plan:  No further follow up planned at this time. The patient has been provided with needed resources.    [facility], [facility]  [phone] [address], [place], [place] [address] Phone: [phone] Email: [email or web address]

## 2021-12-01 ENCOUNTER — Encounter: Payer: Self-pay | Admitting: Emergency Medicine

## 2021-12-01 ENCOUNTER — Ambulatory Visit (INDEPENDENT_AMBULATORY_CARE_PROVIDER_SITE_OTHER): Payer: Medicare HMO | Admitting: Emergency Medicine

## 2021-12-01 VITALS — BP 130/70 | HR 76 | Temp 97.8°F | Ht 74.0 in | Wt 355.0 lb

## 2021-12-01 DIAGNOSIS — R7303 Prediabetes: Secondary | ICD-10-CM | POA: Diagnosis not present

## 2021-12-01 DIAGNOSIS — E785 Hyperlipidemia, unspecified: Secondary | ICD-10-CM | POA: Diagnosis not present

## 2021-12-01 DIAGNOSIS — S8391XA Sprain of unspecified site of right knee, initial encounter: Secondary | ICD-10-CM | POA: Diagnosis not present

## 2021-12-01 DIAGNOSIS — Z23 Encounter for immunization: Secondary | ICD-10-CM | POA: Diagnosis not present

## 2021-12-01 DIAGNOSIS — I10 Essential (primary) hypertension: Secondary | ICD-10-CM | POA: Diagnosis not present

## 2021-12-01 LAB — LIPID PANEL
Cholesterol: 168 mg/dL (ref 0–200)
HDL: 36.4 mg/dL — ABNORMAL LOW (ref >39.00)
LDL Cholesterol: 108 mg/dL — ABNORMAL HIGH (ref 0–99)
NonHDL: 131.53
Total CHOL/HDL Ratio: 5
Triglycerides: 119 mg/dL (ref 0.0–149.0)
VLDL: 23.8 mg/dL (ref 0.0–40.0)

## 2021-12-01 LAB — COMPREHENSIVE METABOLIC PANEL
ALT: 19 U/L (ref 0–53)
AST: 19 U/L (ref 0–37)
Albumin: 3.8 g/dL (ref 3.5–5.2)
Alkaline Phosphatase: 81 U/L (ref 39–117)
BUN: 12 mg/dL (ref 6–23)
CO2: 34 mEq/L — ABNORMAL HIGH (ref 19–32)
Calcium: 9.5 mg/dL (ref 8.4–10.5)
Chloride: 100 mEq/L (ref 96–112)
Creatinine, Ser: 0.92 mg/dL (ref 0.40–1.50)
GFR: 86.38 mL/min (ref >60.00)
Glucose, Bld: 89 mg/dL (ref 70–99)
Potassium: 3.9 mEq/L (ref 3.5–5.1)
Sodium: 138 mEq/L (ref 135–145)
Total Bilirubin: 0.8 mg/dL (ref 0.2–1.2)
Total Protein: 7.8 g/dL (ref 6.0–8.3)

## 2021-12-01 LAB — HEMOGLOBIN A1C: Hgb A1c MFr Bld: 6 % (ref 4.6–6.5)

## 2021-12-01 NOTE — Assessment & Plan Note (Signed)
Diet and nutrition discussed. 

## 2021-12-01 NOTE — Patient Instructions (Signed)
Hypertension, Adult High blood pressure (hypertension) is when the force of blood pumping through the arteries is too strong. The arteries are the blood vessels that carry blood from the heart throughout the body. Hypertension forces the heart to work harder to pump blood and may cause arteries to become narrow or stiff. Untreated or uncontrolled hypertension can lead to a heart attack, heart failure, a stroke, kidney disease, and other problems. A blood pressure reading consists of a higher number over a lower number. Ideally, your blood pressure should be below 120/80. The first ("top") number is called the systolic pressure. It is a measure of the pressure in your arteries as your heart beats. The second ("bottom") number is called the diastolic pressure. It is a measure of the pressure in your arteries as the heart relaxes. What are the causes? The exact cause of this condition is not known. There are some conditions that result in high blood pressure. What increases the risk? Certain factors may make you more likely to develop high blood pressure. Some of these risk factors are under your control, including: Smoking. Not getting enough exercise or physical activity. Being overweight. Having too much fat, sugar, calories, or salt (sodium) in your diet. Drinking too much alcohol. Other risk factors include: Having a personal history of heart disease, diabetes, high cholesterol, or kidney disease. Stress. Having a family history of high blood pressure and high cholesterol. Having obstructive sleep apnea. Age. The risk increases with age. What are the signs or symptoms? High blood pressure may not cause symptoms. Very high blood pressure (hypertensive crisis) may cause: Headache. Fast or irregular heartbeats (palpitations). Shortness of breath. Nosebleed. Nausea and vomiting. Vision changes. Severe chest pain, dizziness, and seizures. How is this diagnosed? This condition is diagnosed by  measuring your blood pressure while you are seated, with your arm resting on a flat surface, your legs uncrossed, and your feet flat on the floor. The cuff of the blood pressure monitor will be placed directly against the skin of your upper arm at the level of your heart. Blood pressure should be measured at least twice using the same arm. Certain conditions can cause a difference in blood pressure between your right and left arms. If you have a high blood pressure reading during one visit or you have normal blood pressure with other risk factors, you may be asked to: Return on a different day to have your blood pressure checked again. Monitor your blood pressure at home for 1 week or longer. If you are diagnosed with hypertension, you may have other blood or imaging tests to help your health care provider understand your overall risk for other conditions. How is this treated? This condition is treated by making healthy lifestyle changes, such as eating healthy foods, exercising more, and reducing your alcohol intake. You may be referred for counseling on a healthy diet and physical activity. Your health care provider may prescribe medicine if lifestyle changes are not enough to get your blood pressure under control and if: Your systolic blood pressure is above 130. Your diastolic blood pressure is above 80. Your personal target blood pressure may vary depending on your medical conditions, your age, and other factors. Follow these instructions at home: Eating and drinking  Eat a diet that is high in fiber and potassium, and low in sodium, added sugar, and fat. An example of this eating plan is called the DASH diet. DASH stands for Dietary Approaches to Stop Hypertension. To eat this way: Eat   plenty of fresh fruits and vegetables. Try to fill one half of your plate at each meal with fruits and vegetables. Eat whole grains, such as whole-wheat pasta, brown rice, or whole-grain bread. Fill about one  fourth of your plate with whole grains. Eat or drink low-fat dairy products, such as skim milk or low-fat yogurt. Avoid fatty cuts of meat, processed or cured meats, and poultry with skin. Fill about one fourth of your plate with lean proteins, such as fish, chicken without skin, beans, eggs, or tofu. Avoid pre-made and processed foods. These tend to be higher in sodium, added sugar, and fat. Reduce your daily sodium intake. Many people with hypertension should eat less than 1,500 mg of sodium a day. Do not drink alcohol if: Your health care provider tells you not to drink. You are pregnant, may be pregnant, or are planning to become pregnant. If you drink alcohol: Limit how much you have to: 0-1 drink a day for women. 0-2 drinks a day for men. Know how much alcohol is in your drink. In the [place] one drink equals one 12 oz bottle of beer (355 mL), one 5 oz glass of wine (148 mL), or one 1 oz glass of hard liquor (44 mL). Lifestyle  Work with your health care provider to maintain a healthy body weight or to lose weight. Ask what an ideal weight is for you. Get at least 30 minutes of exercise that causes your heart to beat faster (aerobic exercise) most days of the week. Activities may include walking, swimming, or biking. Include exercise to strengthen your muscles (resistance exercise), such as Pilates or lifting weights, as part of your weekly exercise routine. Try to do these types of exercises for 30 minutes at least 3 days a week. Do not use any products that contain nicotine or tobacco. These products include cigarettes, chewing tobacco, and vaping devices, such as e-cigarettes. If you need help quitting, ask your health care provider. Monitor your blood pressure at home as told by your health care provider. Keep all follow-up visits. This is important. Medicines Take over-the-counter and prescription medicines only as told by your health care provider. Follow directions carefully. Blood  pressure medicines must be taken as prescribed. Do not skip doses of blood pressure medicine. Doing this puts you at risk for problems and can make the medicine less effective. Ask your health care provider about side effects or reactions to medicines that you should watch for. Contact a health care provider if you: Think you are having a reaction to a medicine you are taking. Have headaches that keep coming back (recurring). Feel dizzy. Have swelling in your ankles. Have trouble with your vision. Get help right away if you: Develop a severe headache or confusion. Have unusual weakness or numbness. Feel faint. Have severe pain in your chest or abdomen. Vomit repeatedly. Have trouble breathing. These symptoms may be an emergency. Get help right away. Call 911. Do not wait to see if the symptoms will go away. Do not drive yourself to the hospital. Summary Hypertension is when the force of blood pumping through your arteries is too strong. If this condition is not controlled, it may put you at risk for serious complications. Your personal target blood pressure may vary depending on your medical conditions, your age, and other factors. For most people, a normal blood pressure is less than 120/80. Hypertension is treated with lifestyle changes, medicines, or a combination of both. Lifestyle changes include losing weight, eating a healthy,   low-sodium diet, exercising more, and limiting alcohol. This information is not intended to replace advice given to you by your health care provider. Make sure you discuss any questions you have with your health care provider. Document Revised: 11/11/2020 Document Reviewed: 11/11/2020 Elsevier Patient Education  2023 Elsevier Inc.  

## 2021-12-01 NOTE — Assessment & Plan Note (Signed)
Stable.  Hemoglobin A1c done today. Diet and nutrition discussed. Advised to decrease amount of daily carbohydrate intake and daily calories and increase amount of plant based protein in his diet.

## 2021-12-01 NOTE — Assessment & Plan Note (Signed)
Well-controlled hypertension with normal blood pressure readings at home. Continue chlorthalidone 25 mg daily. Cardiovascular risks associated with hypertension discussed. Dietary approaches to stop hypertension discussed. Follow-up in 6 months.

## 2021-12-01 NOTE — Assessment & Plan Note (Signed)
Stable.  Diet and nutrition discussed. Continue simvastatin 80 mg daily. The 10-year ASCVD risk score (Arnett DK, et al., 2019) is: 18.4%   Values used to calculate the score:     Age: 66 years     Sex: Male     Is Non-Hispanic African American: Yes     Diabetic: No     Tobacco smoker: No     Systolic Blood Pressure: 500 mmHg     Is BP treated: Yes     HDL Cholesterol: 33.9 mg/dL     Total Cholesterol: 157 mg/dL

## 2021-12-01 NOTE — Assessment & Plan Note (Signed)
Recent injury about 2 months ago.  Persistent pain. Needs orthopedic evaluation. Referral to sports medicine placed today.

## 2021-12-01 NOTE — Progress Notes (Signed)
Nathan Fleming 66 y.o.   Chief Complaint  Patient presents with   Follow-up    59mth f/u appt, pulling sensation behind right knee x 2-3 months     HISTORY OF PRESENT ILLNESS: This is a 66y.o. male here for 685-monthollow-up of hypertension and dyslipidemia. BP Readings from Last 3 Encounters:  06/02/21 134/72  11/27/20 132/82  04/22/20 132/80   Wt Readings from Last 3 Encounters:  12/01/21 (!) 355 lb (161 kg)  06/02/21 (!) 353 lb (160.1 kg)  11/27/20 (!) 354 lb (160.6 kg)     HPI   Prior to Admission medications   Medication Sig Start Date End Date Taking? Authorizing Provider  albuterol (VENTOLIN HFA) 108 (90 Base) MCG/ACT inhaler Inhale 2 puffs into the lungs every 6 (six) hours as needed for wheezing or shortness of breath. 04/22/20  Yes [name], MiInes BloomerMD  Blood Pressure KIT Take blood pressure reading once a day at different times for the next several weeks. 04/22/20  Yes [name], MiInes BloomerMD  Blood Pressure Monitoring (BLOOD PRESSURE MONITOR/WRIST) DEVI 1 Device by Does not apply route daily. 08/30/13  Yes McBarton FannyMD  cetirizine (ZYRTEC) 10 MG tablet Take 1 tablet (10 mg total) by mouth daily. 05/08/21 05/03/22 Yes [name], MiInes BloomerMD  chlorthalidone (HYGROTON) 25 MG tablet TAKE 1/2 TABLET BY MOUTH EVERY DAY 01/04/21  Yes [name], MiInes BloomerMD  clotrimazole-betamethasone (LOTRISONE) cream Apply 1 application topically 2 (two) times daily. 02/13/19  Yes [name], MiInes BloomerMD  Menthol, Topical Analgesic, (BIOFREEZE EX) Apply 1 application topically daily as needed (knee pain).   Yes [provider]  simvastatin (ZOCOR) 80 MG tablet TAKE 1 TABLET BY MOUTH EVERY DAY AT 6 PM 07/22/21  Yes [name], MiInes BloomerMD  triamcinolone (NASACORT) 55 MCG/ACT AERO nasal inhaler Place 2 sprays into the nose daily. 04/22/20  Yes SaHorald PollenMD    No Known Allergies  Patient Active Problem List   Diagnosis Date Noted   Hypertensive  retinopathy of both eyes 11/27/2020   Impaired ambulation 11/27/2020   Macular pucker, left eye 11/18/2020   Right epiretinal membrane 11/18/2020   Pseudophakia, both eyes 11/18/2020   Essential hypertension 04/25/2018   Seasonal allergies 04/25/2018   Prediabetes 09/26/2017   Bilateral low back pain without sciatica 10/01/2014   Elevated blood-pressure reading without diagnosis of hypertension 10/01/2014   Allergic rhinitis 08/20/2014   Chronic edema 08/20/2014   Hyperthyroidism 09/26/2013   Other testicular hypofunction 09/26/2013   Erectile dysfunction 08/30/2013   Dyslipidemia 03/04/2013   Obesity, morbid (HCOriental02/15/2015    Past Medical History:  Diagnosis Date   Allergy    Arthritis    Hyperlipidemia     Past Surgical History:  Procedure Laterality Date   HIP SURGERY     KNEE SURGERY     SHOULDER SURGERY      Social History   Socioeconomic History   Marital status: Married    Spouse name: Not on file   Number of children: Not on file   Years of education: Not on file   Highest education level: Not on file  Occupational History   Occupation: retired  Tobacco Use   Smoking status: Former    Packs/day: 0.25    Years: 10.00    Total pack years: 2.50    Types: Cigarettes    Quit date: 01/19/1988    Years since quitting: 33.8   Smokeless tobacco: Never  Substance and Sexual Activity  Alcohol use: No   Drug use: No   Sexual activity: Yes  Other Topics Concern   Not on file  Social History Narrative   Divorced; Moved from [place] in 2014   Retired; has 1 son   Social Determinants of Radio broadcast assistant Strain: High Risk (07/31/2021)   Overall Financial Resource Strain (CARDIA)    Difficulty of Paying Living Expenses: Hard  Food Insecurity: Food Insecurity Present (07/31/2021)   Hunger Vital Sign    Worried About Running Out of Food in the Last Year: Often true    Ran Out of Food in the Last Year: Often true  Transportation Needs: Unmet  Transportation Needs (07/31/2021)   PRAPARE - Hydrologist (Medical): Yes    Lack of Transportation (Non-Medical): Yes  Physical Activity: Sufficiently Active (07/29/2021)   Exercise Vital Sign    Days of Exercise per Week: 7 days    Minutes of Exercise per Session: 30 min  Stress: No Stress Concern Present (07/29/2021)   [place]    Feeling of Stress : Not at all  Social Connections: Moderately Integrated (07/29/2021)   Social Connection and Isolation Panel [NHANES]    Frequency of Communication with Friends and Family: More than three times a week    Frequency of Social Gatherings with Friends and Family: Once a week    Attends Religious Services: More than 4 times per year    Active Member of [employer] or Organizations: No    Attends Music therapist: More than 4 times per year    Marital Status: Divorced  Human resources officer Violence: Not At Risk (07/29/2021)   Humiliation, Afraid, Rape, and Kick questionnaire    Fear of Current or Ex-Partner: No    Emotionally Abused: No    Physically Abused: No    Sexually Abused: No    Family History  Problem Relation Age of Onset   Diabetes Mother    Stroke Father    Diabetes Sister    Thyroid disease Neg Hx      Review of Systems  Constitutional: Negative.  Negative for chills and fever.  HENT: Negative.  Negative for congestion and sore throat.   Respiratory: Negative.  Negative for cough and shortness of breath.   Cardiovascular: Negative.  Negative for chest pain and palpitations.  Gastrointestinal:  Negative for abdominal pain, diarrhea, nausea and vomiting.  Genitourinary: Negative.  Negative for dysuria and hematuria.  Musculoskeletal: Negative.   Skin: Negative.  Negative for rash.  Neurological: Negative.  Negative for dizziness and headaches.  All other systems reviewed and are negative.  Today's Vitals   12/01/21 0806   Weight: (!) 355 lb (161 kg)  Height: _0  (1.88 m)   Body mass index is 45.58 kg/m.   Physical Exam Vitals reviewed.  Constitutional:      Appearance: Normal appearance.  HENT:     Head: Normocephalic.  Eyes:     Extraocular Movements: Extraocular movements intact.  Cardiovascular:     Rate and Rhythm: Normal rate.  Pulmonary:     Effort: Pulmonary effort is normal.  Skin:    General: Skin is warm and dry.  Neurological:     General: No focal deficit present.     Mental Status: He is alert and oriented to person, place, and time.  Psychiatric:        Mood and Affect: Mood normal.  Behavior: Behavior normal.      ASSESSMENT & PLAN: A total of 45 minutes was spent with the patient and counseling/coordination of care regarding preparing for this visit, review of most recent office visit note, review of multiple chronic medical problems and their management, review of all medications, review of health maintenance items and need for colon cancer screening, need for vaccinations, education on nutrition, cardiovascular risks associated with hypertension and dyslipidemia, prognosis, documentation, need for follow-up..  Problem List Items Addressed This Visit       Cardiovascular and Mediastinum   Essential hypertension - Primary    Well-controlled hypertension with normal blood pressure readings at home. Continue chlorthalidone 25 mg daily. Cardiovascular risks associated with hypertension discussed. Dietary approaches to stop hypertension discussed. Follow-up in 6 months.      Relevant Orders   Comprehensive metabolic panel     Musculoskeletal and Integument   Sprain of right knee    Recent injury about 2 months ago.  Persistent pain. Needs orthopedic evaluation. Referral to sports medicine placed today.      Relevant Orders   Ambulatory referral to Sports Medicine     Other   Dyslipidemia    Stable.  Diet and nutrition discussed. Continue simvastatin  80 mg daily. The 10-year ASCVD risk score (Arnett DK, et al., 2019) is: 18.4%   Values used to calculate the score:     Age: 60 years     Sex: Male     Is Non-Hispanic African American: Yes     Diabetic: No     Tobacco smoker: No     Systolic Blood Pressure: 326 mmHg     Is BP treated: Yes     HDL Cholesterol: 33.9 mg/dL     Total Cholesterol: 157 mg/dL       Relevant Orders   Lipid panel   Obesity, morbid (HCC)    Diet and nutrition discussed.      Prediabetes    Stable.  Hemoglobin A1c done today. Diet and nutrition discussed. Advised to decrease amount of daily carbohydrate intake and daily calories and increase amount of plant based protein in his diet.      Relevant Orders   Hemoglobin A1c   Other Visit Diagnoses     Need for vaccination       Relevant Orders   Flu Vaccine QUAD High Dose(Fluad) (Completed)   Pneumococcal conjugate vaccine 20-valent (Prevnar 20) (Completed)      Patient Instructions  Hypertension, Adult High blood pressure (hypertension) is when the force of blood pumping through the arteries is too strong. The arteries are the blood vessels that carry blood from the heart throughout the body. Hypertension forces the heart to work harder to pump blood and may cause arteries to become narrow or stiff. Untreated or uncontrolled hypertension can lead to a heart attack, heart failure, a stroke, kidney disease, and other problems. A blood pressure reading consists of a higher number over a lower number. Ideally, your blood pressure should be below 120/80. The first ("top") number is called the systolic pressure. It is a measure of the pressure in your arteries as your heart beats. The second ("bottom") number is called the diastolic pressure. It is a measure of the pressure in your arteries as the heart relaxes. What are the causes? The exact cause of this condition is not known. There are some conditions that result in high blood pressure. What increases  the risk? Certain factors may make you more likely to  develop high blood pressure. Some of these risk factors are under your control, including: Smoking. Not getting enough exercise or physical activity. Being overweight. Having too much fat, sugar, calories, or salt (sodium) in your diet. Drinking too much alcohol. Other risk factors include: Having a personal history of heart disease, diabetes, high cholesterol, or kidney disease. Stress. Having a family history of high blood pressure and high cholesterol. Having obstructive sleep apnea. Age. The risk increases with age. What are the signs or symptoms? High blood pressure may not cause symptoms. Very high blood pressure (hypertensive crisis) may cause: Headache. Fast or irregular heartbeats (palpitations). Shortness of breath. Nosebleed. Nausea and vomiting. Vision changes. Severe chest pain, dizziness, and seizures. How is this diagnosed? This condition is diagnosed by measuring your blood pressure while you are seated, with your arm resting on a flat surface, your legs uncrossed, and your feet flat on the floor. The cuff of the blood pressure monitor will be placed directly against the skin of your upper arm at the level of your heart. Blood pressure should be measured at least twice using the same arm. Certain conditions can cause a difference in blood pressure between your right and left arms. If you have a high blood pressure reading during one visit or you have normal blood pressure with other risk factors, you may be asked to: Return on a different day to have your blood pressure checked again. Monitor your blood pressure at home for 1 week or longer. If you are diagnosed with hypertension, you may have other blood or imaging tests to help your health care provider understand your overall risk for other conditions. How is this treated? This condition is treated by making healthy lifestyle changes, such as eating healthy foods,  exercising more, and reducing your alcohol intake. You may be referred for counseling on a healthy diet and physical activity. Your health care provider may prescribe medicine if lifestyle changes are not enough to get your blood pressure under control and if: Your systolic blood pressure is above 130. Your diastolic blood pressure is above 80. Your personal target blood pressure may vary depending on your medical conditions, your age, and other factors. Follow these instructions at home: Eating and drinking  Eat a diet that is high in fiber and potassium, and low in sodium, added sugar, and fat. An example of this eating plan is called the DASH diet. DASH stands for Dietary Approaches to Stop Hypertension. To eat this way: Eat plenty of fresh fruits and vegetables. Try to fill one half of your plate at each meal with fruits and vegetables. Eat whole grains, such as whole-wheat pasta, brown rice, or whole-grain bread. Fill about one fourth of your plate with whole grains. Eat or drink low-fat dairy products, such as skim milk or low-fat yogurt. Avoid fatty cuts of meat, processed or cured meats, and poultry with skin. Fill about one fourth of your plate with lean proteins, such as fish, chicken without skin, beans, eggs, or tofu. Avoid pre-made and processed foods. These tend to be higher in sodium, added sugar, and fat. Reduce your daily sodium intake. Many people with hypertension should eat less than 1,500 mg of sodium a day. Do not drink alcohol if: Your health care provider tells you not to drink. You are pregnant, may be pregnant, or are planning to become pregnant. If you drink alcohol: Limit how much you have to: 0-1 drink a day for women. 0-2 drinks a day for men. Know how  much alcohol is in your drink. In the [place] one drink equals one 12 oz bottle of beer (355 mL), one 5 oz glass of wine (148 mL), or one 1 oz glass of hard liquor (44 mL). Lifestyle  Work with your health care  provider to maintain a healthy body weight or to lose weight. Ask what an ideal weight is for you. Get at least 30 minutes of exercise that causes your heart to beat faster (aerobic exercise) most days of the week. Activities may include walking, swimming, or biking. Include exercise to strengthen your muscles (resistance exercise), such as Pilates or lifting weights, as part of your weekly exercise routine. Try to do these types of exercises for 30 minutes at least 3 days a week. Do not use any products that contain nicotine or tobacco. These products include cigarettes, chewing tobacco, and vaping devices, such as e-cigarettes. If you need help quitting, ask your health care provider. Monitor your blood pressure at home as told by your health care provider. Keep all follow-up visits. This is important. Medicines Take over-the-counter and prescription medicines only as told by your health care provider. Follow directions carefully. Blood pressure medicines must be taken as prescribed. Do not skip doses of blood pressure medicine. Doing this puts you at risk for problems and can make the medicine less effective. Ask your health care provider about side effects or reactions to medicines that you should watch for. Contact a health care provider if you: Think you are having a reaction to a medicine you are taking. Have headaches that keep coming back (recurring). Feel dizzy. Have swelling in your ankles. Have trouble with your vision. Get help right away if you: Develop a severe headache or confusion. Have unusual weakness or numbness. Feel faint. Have severe pain in your chest or abdomen. Vomit repeatedly. Have trouble breathing. These symptoms may be an emergency. Get help right away. Call 911. Do not wait to see if the symptoms will go away. Do not drive yourself to the hospital. Summary Hypertension is when the force of blood pumping through your arteries is too strong. If this condition  is not controlled, it may put you at risk for serious complications. Your personal target blood pressure may vary depending on your medical conditions, your age, and other factors. For most people, a normal blood pressure is less than 120/80. Hypertension is treated with lifestyle changes, medicines, or a combination of both. Lifestyle changes include losing weight, eating a healthy, low-sodium diet, exercising more, and limiting alcohol. This information is not intended to replace advice given to you by your health care provider. Make sure you discuss any questions you have with your health care provider. Document Revised: 11/11/2020 Document Reviewed: 11/11/2020 Elsevier Patient Education  [place], MD [facility]

## 2021-12-08 ENCOUNTER — Telehealth: Payer: Self-pay | Admitting: Emergency Medicine

## 2021-12-08 ENCOUNTER — Ambulatory Visit: Payer: Medicare HMO | Admitting: Family Medicine

## 2021-12-08 DIAGNOSIS — R7303 Prediabetes: Secondary | ICD-10-CM

## 2021-12-08 DIAGNOSIS — L602 Onychogryphosis: Secondary | ICD-10-CM

## 2021-12-08 NOTE — Telephone Encounter (Signed)
Called patient to informing patient that a referral has been place to podiatry

## 2021-12-08 NOTE — Telephone Encounter (Signed)
Pt called to request a REFERRAL to PODIATRY for toenail trimming.   Pt says he has difficulty bending over to reach his toenails. Pt advised the specialist will call him within 2 weeks to schedule.  Pt phone [phone] for any further instructions

## 2021-12-14 NOTE — Progress Notes (Unsigned)
I, Nathan Fleming, LAT, ATC acting as a scribe for Nathan Leader, Nathan Fleming.  Subjective:    CC: R knee pain  HPI: Pt is a 66 y/o male c/o R knee pain x 3 months. MOI: Pt was in the shower, when the bath mat slipped causing a valgus for on the R knee. Pt locates pain to the posterior aspect of the R knee. Pt describes the pain as a "pulling" sensation.  R Knee swelling: yes Mechanical symptoms: no Aggravates: transitioning to stand, walking Treatments tried: naproxen, knee brace  Pertinent review of Systems: No fevers or chills  Relevant historical information: Hypertension.  History of left knee total knee replacement and right total hip replacement.   Objective:    Vitals:   12/15/21 0817  BP: (!) 158/88  Pulse: 76  SpO2: 94%   General: Well Developed, well nourished, and in no acute distress.   MSK: Right knee mild effusion normal motion with crepitation.  Tender palpation medial joint line.  Stable ligamentous exam intact strength.  Lab and Radiology Results  Procedure: Real-time Ultrasound Guided Injection of right knee superior lateral patellar space Device: Philips Affiniti 50G Images permanently stored and available for review in PACS Ultrasound evaluation prior to injection reveals mild joint effusion.  Degenerative changes of medial joint line are visible.  Small Baker's cyst is visible as well at posterior knee. Verbal informed consent obtained.  Discussed risks and benefits of procedure. Warned about infection, bleeding, hyperglycemia damage to structures among others. Patient expresses understanding and agreement Time-out conducted.   Noted no overlying erythema, induration, or other signs of local infection.   Skin prepped in a sterile fashion.   Local anesthesia: Topical Ethyl chloride.   With sterile technique and under real time ultrasound guidance: 40 mg of Kenalog and 2 mL of Marcaine injected into knee joint. Fluid seen entering the joint capsule.    Completed without difficulty   Pain immediately resolved suggesting accurate placement of the medication.   Advised to call if fevers/chills, erythema, induration, drainage, or persistent bleeding.   Images permanently stored and available for review in the ultrasound unit.  Impression: Technically successful ultrasound guided injection.     X-ray images right knee obtained today personally and independently interpreted Severe lateral joint line DJD.  Mild medial compartment DJD.  Moderate to mild patellofemoral DJD.  No acute fractures are visible. Await formal radiology review    Impression and Recommendations:    Assessment and Plan: 66 y.o. male with right knee pain.  Patient injured his knee after slipping in the shower about 3 months ago.  I think the majority of his pain is due to an exacerbation of DJD. There may be some soft tissue injury such as a meniscus tear that also happened during the injury but given the significance of his DJD I think that the lesser issue.  Plan for trial of steroid injection today.  If that does not work well enough we can attempt to aspirate his Baker's cyst. Recheck back as needed.  PDMP not reviewed this encounter. Orders Placed This Encounter  Procedures   [place] LIMITED JOINT SPACE STRUCTURES LOW RIGHT(NO LINKED CHARGES)    Order Specific Question:   Reason for Exam (SYMPTOM  OR DIAGNOSIS REQUIRED)    Answer:   right knee pain    Order Specific Question:   Preferred imaging location?    Answer:   [facility]   DG Knee AP/LAT W/Sunrise Right    Standing  Status:   Future    Number of Occurrences:   1    Standing Expiration Date:   01/14/2022    Order Specific Question:   Reason for Exam (SYMPTOM  OR DIAGNOSIS REQUIRED)    Answer:   right knee pain    Order Specific Question:   Preferred imaging location?    Answer:   [facility]   No orders of the defined types were placed in this encounter.   Discussed  warning signs or symptoms. Please see discharge instructions. Patient expresses understanding.   The above documentation has been reviewed and is accurate and complete Nathan Fleming, M.D.

## 2021-12-15 ENCOUNTER — Ambulatory Visit (INDEPENDENT_AMBULATORY_CARE_PROVIDER_SITE_OTHER): Payer: Medicare HMO

## 2021-12-15 ENCOUNTER — Ambulatory Visit: Payer: Medicare HMO | Admitting: Family Medicine

## 2021-12-15 ENCOUNTER — Ambulatory Visit: Payer: Self-pay

## 2021-12-15 VITALS — BP 158/88 | HR 76 | Ht 74.0 in | Wt 350.0 lb

## 2021-12-15 DIAGNOSIS — G8929 Other chronic pain: Secondary | ICD-10-CM

## 2021-12-15 DIAGNOSIS — M25561 Pain in right knee: Secondary | ICD-10-CM | POA: Diagnosis not present

## 2021-12-15 NOTE — Patient Instructions (Signed)
Thank you for coming in today.   Please get an Xray today before you leave   You received an injection today. Seek immediate medical attention if the joint becomes red, extremely painful, or is oozing fluid.   Please use Voltaren gel (Generic Diclofenac Gel) up to 4x daily for pain as needed.  This is available over-the-counter as both the name brand Voltaren gel and the generic diclofenac gel.

## 2021-12-16 NOTE — Progress Notes (Signed)
Right knee x-ray shows some arthritis changes some more severe.

## 2021-12-22 ENCOUNTER — Ambulatory Visit: Payer: Medicare HMO | Admitting: Podiatry

## 2021-12-22 ENCOUNTER — Encounter: Payer: Self-pay | Admitting: Podiatry

## 2021-12-22 DIAGNOSIS — M79674 Pain in right toe(s): Secondary | ICD-10-CM

## 2021-12-22 DIAGNOSIS — M79675 Pain in left toe(s): Secondary | ICD-10-CM

## 2021-12-22 DIAGNOSIS — B351 Tinea unguium: Secondary | ICD-10-CM

## 2021-12-22 NOTE — Progress Notes (Signed)
  Subjective:  Patient ID: Nathan Fleming, male    DOB: 1955/11/27,  MRN: [id number]  Chief Complaint  Patient presents with   Nail Problem    NP  thick painful toenails    66 y.o. male presents with the above complaint. History confirmed with patient.  He is unable to cut them they have been causing difficulty wearing shoes and pain in his feet  Objective:  Physical Exam: warm, good capillary refill, no trophic changes or ulcerative lesions, normal DP and PT pulses, and normal sensory exam. Left Foot: dystrophic yellowed discolored nail plates with subungual debris Right Foot: dystrophic yellowed discolored nail plates with subungual debris  Assessment:   1. Pain due to onychomycosis of toenails of both feet      Plan:  Patient was evaluated and treated and all questions answered.  Discussed the etiology and treatment options for the condition in detail with the patient. Educated patient on the topical and oral treatment options for mycotic nails. Recommended debridement of the nails today. Sharp and mechanical debridement performed of all painful and mycotic nails today. Nails debrided in length and thickness using a nail nipper to level of comfort. Discussed treatment options including appropriate shoe gear. Follow up as needed for painful nails.    Return in about 3 months (around 03/23/2022) for painful nails .

## 2021-12-26 ENCOUNTER — Other Ambulatory Visit: Payer: Self-pay | Admitting: Emergency Medicine

## 2021-12-26 DIAGNOSIS — I10 Essential (primary) hypertension: Secondary | ICD-10-CM

## 2022-04-06 ENCOUNTER — Ambulatory Visit: Payer: Medicare HMO | Admitting: Podiatry

## 2022-05-07 ENCOUNTER — Telehealth: Payer: Self-pay | Admitting: Emergency Medicine

## 2022-05-07 NOTE — Telephone Encounter (Signed)
Prescription Request  05/07/2022  LOV: 12/01/2021  What is the name of the medication or equipment? sildenafil (VIAGRA) 100 MG tablet   Have you contacted your pharmacy to request a refill? No   Which pharmacy would you like this sent to?  [id number] [place],  - [address] [place] [address] Phone: [phone] Fax: [phone]    Patient notified that their request is being sent to the clinical staff for review and that they should receive a response within 2 business days.   Please advise at Mobile [phone] (mobile)

## 2022-05-09 ENCOUNTER — Other Ambulatory Visit: Payer: Self-pay | Admitting: Emergency Medicine

## 2022-05-09 MED ORDER — SILDENAFIL CITRATE 100 MG PO TABS: 50.0000 mg | ORAL_TABLET | Freq: Every day | ORAL | 11 refills | Status: AC | PRN

## 2022-05-09 NOTE — Telephone Encounter (Signed)
New prescription for Viagra sent to pharmacy requested today.  Thanks.

## 2022-05-19 ENCOUNTER — Encounter: Payer: Self-pay | Admitting: Urology

## 2022-05-19 ENCOUNTER — Ambulatory Visit: Payer: Medicare HMO | Admitting: Urology

## 2022-05-19 VITALS — BP 122/81 | HR 86 | Ht 74.0 in

## 2022-05-19 DIAGNOSIS — N5089 Other specified disorders of the male genital organs: Secondary | ICD-10-CM

## 2022-05-19 DIAGNOSIS — N4883 Acquired buried penis: Secondary | ICD-10-CM

## 2022-05-19 NOTE — Progress Notes (Signed)
Assessment: 1. Acquired buried penis   2. Scrotal swelling      Plan: Today I had a long and detailed discussion with the patient regarding his acquired buried penis from morbid obesity.  I discussed that this would be a very difficult surgical undertaking which would really not result in much improved outcomes given the overlying suprapubic fat pad and lower abdominal fat.  Patient states he is very interested in trying to go on a weight loss program which I think would be quite beneficial.  I did however recommend a scrotal ultrasound to verify what on exam is most likely a left-sided hydrocele.  I did not think it was related to any type of hernia.  Will contact with results of ultrasound.  Chief Complaint: scrotal swelling  History of Present Illness:  Nathan Fleming is a 67 y.o. male with a past medical history of hypertension dyslipidemia, prediabetes, morbid obesity who is seen in consultation from Georgina Quint, MD for evaluation of phimosis and left scrotal swelling. Patient has been seen by [facility] previously for this isse (Dr. Benancio Deeds 05/2021). He reports no issues with LUTS.  On exam he has a circumcised phallus with a lot of chronic skin changes (BXO) due to acquired buried penis from morbid obesity.  He also has left scrotal swelling consistent with a hydrocele/spermatocele which is nontender and does not appear to bother him.   Past Medical History:  Past Medical History:  Diagnosis Date   Allergy    Arthritis    Hyperlipidemia     Past Surgical History:  Past Surgical History:  Procedure Laterality Date   HIP SURGERY     KNEE SURGERY     SHOULDER SURGERY      Allergies:  No Known Allergies  Family History:  Family History  Problem Relation Age of Onset   Diabetes Mother    Stroke Father    Diabetes Sister    Thyroid disease Neg Hx     Social History:  Social History   Tobacco Use   Smoking status: Former    Packs/day: 0.25     Years: 10.00    Additional pack years: 0.00    Total pack years: 2.50    Types: Cigarettes    Quit date: 01/19/1988    Years since quitting: 34.3   Smokeless tobacco: Never  Substance Use Topics   Alcohol use: No   Drug use: No    Review of symptoms:  Constitutional:  Negative for unexplained weight loss, night sweats, fever, chills ENT:  Negative for nose bleeds, sinus pain, painful swallowing CV:  Negative for chest pain, shortness of breath, exercise intolerance, palpitations, loss of consciousness Resp:  Negative for cough, wheezing, shortness of breath GI:  Negative for nausea, vomiting, diarrhea, bloody stools GU:  Positives noted in HPI; otherwise negative for gross hematuria, dysuria, urinary incontinence Neuro:  Negative for seizures, poor balance, limb weakness, slurred speech Psych:  Negative for lack of energy, depression, anxiety Endocrine:  Negative for polydipsia, polyuria, symptoms of hypoglycemia (dizziness, hunger, sweating) Hematologic:  Negative for anemia, purpura, petechia, prolonged or excessive bleeding, use of anticoagulants  Allergic:  Negative for difficulty breathing or choking as a result of exposure to anything; no shellfish allergy; no allergic response (rash/itch) to materials, foods  Physical exam: BP 122/81   Pulse 86   Ht 6\' 2"  (1.88 m)   BMI 44.94 kg/m  GENERAL APPEARANCE:  morbidly obese bm in wheelchair, NAD  GU:  circumcised  phallus with a lot of chronic skin changes (BXO) due to acquired buried penis from morbid obesity.  He also has left scrotal swelling consistent with a hydrocele/spermatocele which is nontender and does not appear to bother him.

## 2022-05-20 NOTE — Addendum Note (Signed)
Addended by: Joline Maxcy on: 05/20/2022 02:42 PM   Modules accepted: Orders

## 2022-05-24 ENCOUNTER — Telehealth (HOSPITAL_BASED_OUTPATIENT_CLINIC_OR_DEPARTMENT_OTHER): Payer: Self-pay

## 2022-06-01 ENCOUNTER — Ambulatory Visit: Payer: Medicare HMO | Admitting: Emergency Medicine

## 2022-06-04 ENCOUNTER — Telehealth (HOSPITAL_BASED_OUTPATIENT_CLINIC_OR_DEPARTMENT_OTHER): Payer: Self-pay

## 2022-06-08 ENCOUNTER — Ambulatory Visit: Payer: Medicare HMO | Admitting: Family Medicine

## 2022-06-08 ENCOUNTER — Ambulatory Visit: Payer: Medicare HMO | Admitting: Emergency Medicine

## 2022-06-08 NOTE — Progress Notes (Deleted)
   Rubin Payor, PhD, LAT, ATC acting as a scribe for Clementeen Graham, MD.  Nathan Fleming is a 67 y.o. male who presents to [facility] today for re-occurring R knee pain. Pt was last seen by Dr. Denyse Amass on 12/15/21 and was given a R knee steroid injection. Today, pt reports ***  R knee swelling: Treatments tried:  Dx imaging: 12/15/21 R knee XR  Pertinent review of systems: ***  Relevant historical information: ***   Exam:  There were no vitals taken for this visit. General: Well Developed, well nourished, and in no acute distress.   MSK: ***    Lab and Radiology Results No results found for this or any previous visit (from the past 72 hour(s)). No results found.     Assessment and Plan: 67 y.o. male with ***   PDMP not reviewed this encounter. No orders of the defined types were placed in this encounter.  No orders of the defined types were placed in this encounter.    Discussed warning signs or symptoms. Please see discharge instructions. Patient expresses understanding.   ***

## 2022-06-21 ENCOUNTER — Ambulatory Visit: Payer: Medicare HMO | Admitting: Emergency Medicine

## 2022-07-01 DIAGNOSIS — M25561 Pain in right knee: Secondary | ICD-10-CM | POA: Diagnosis not present

## 2022-07-08 ENCOUNTER — Other Ambulatory Visit: Payer: Self-pay | Admitting: Emergency Medicine

## 2022-07-08 ENCOUNTER — Encounter: Payer: Self-pay | Admitting: Emergency Medicine

## 2022-07-08 ENCOUNTER — Ambulatory Visit (INDEPENDENT_AMBULATORY_CARE_PROVIDER_SITE_OTHER): Payer: Medicare HMO | Admitting: Emergency Medicine

## 2022-07-08 VITALS — BP 134/82 | HR 77 | Temp 98.4°F | Ht 74.0 in | Wt 342.0 lb

## 2022-07-08 DIAGNOSIS — I10 Essential (primary) hypertension: Secondary | ICD-10-CM | POA: Diagnosis not present

## 2022-07-08 DIAGNOSIS — Z9189 Other specified personal risk factors, not elsewhere classified: Secondary | ICD-10-CM

## 2022-07-08 DIAGNOSIS — E785 Hyperlipidemia, unspecified: Secondary | ICD-10-CM

## 2022-07-08 DIAGNOSIS — L989 Disorder of the skin and subcutaneous tissue, unspecified: Secondary | ICD-10-CM | POA: Diagnosis not present

## 2022-07-08 DIAGNOSIS — J45998 Other asthma: Secondary | ICD-10-CM

## 2022-07-08 DIAGNOSIS — R7303 Prediabetes: Secondary | ICD-10-CM

## 2022-07-08 DIAGNOSIS — N481 Balanitis: Secondary | ICD-10-CM

## 2022-07-08 MED ORDER — BLOOD PRESSURE KIT
PACK | 0 refills | Status: AC
Start: 2022-07-08 — End: ?

## 2022-07-08 MED ORDER — ZEPBOUND 2.5 MG/0.5ML ~~LOC~~ SOAJ: 2.5000 mg | SUBCUTANEOUS | 3 refills | Status: DC

## 2022-07-08 MED ORDER — BLOOD PRESSURE MONITOR/WRIST DEVI: 1.0000 | Freq: Every day | 0 refills | Status: AC

## 2022-07-08 MED ORDER — ALBUTEROL SULFATE HFA 108 (90 BASE) MCG/ACT IN AERS
2.0000 | INHALATION_SPRAY | Freq: Four times a day (QID) | RESPIRATORY_TRACT | 5 refills | Status: AC | PRN
Start: 2022-07-08 — End: ?

## 2022-07-08 MED ORDER — CLOTRIMAZOLE-BETAMETHASONE 1-0.05 % EX CREA
1.0000 | TOPICAL_CREAM | Freq: Two times a day (BID) | CUTANEOUS | 1 refills | Status: AC
Start: 2022-07-08 — End: ?

## 2022-07-08 MED ORDER — WEGOVY 0.25 MG/0.5ML ~~LOC~~ SOAJ
0.2500 mg | SUBCUTANEOUS | 2 refills | Status: DC
Start: 2022-07-08 — End: 2022-07-08

## 2022-07-08 NOTE — Assessment & Plan Note (Signed)
Large skin lesion to scalp Needs dermatology evaluation Referral placed today.

## 2022-07-08 NOTE — Assessment & Plan Note (Signed)
BP Readings from Last 3 Encounters:  07/08/22 134/82  05/19/22 122/81  12/15/21 (!) 158/88  Well-controlled hypertension Continue chlorthalidone 25 mg daily Cardiovascular risks associated with hypertension discussed Dietary approaches to stop hypertension discussed

## 2022-07-08 NOTE — Patient Instructions (Signed)
Calorie Counting for Weight Loss Calories are units of energy. Your body needs a certain number of calories from food to keep going throughout the day. When you eat or drink more calories than your body needs, your body stores the extra calories mostly as fat. When you eat or drink fewer calories than your body needs, your body burns fat to get the energy it needs. Calorie counting means keeping track of how many calories you eat and drink each day. Calorie counting can be helpful if you need to lose weight. If you eat fewer calories than your body needs, you should lose weight. Ask your health care provider what a healthy weight is for you. For calorie counting to work, you will need to eat the right number of calories each day to lose a healthy amount of weight per week. A dietitian can help you figure out how many calories you need in a day and will suggest ways to reach your calorie goal. A healthy amount of weight to lose each week is usually 1-2 lb (0.5-0.9 kg). This usually means that your daily calorie intake should be reduced by 500-750 calories. Eating 1,200-1,500 calories a day can help most women lose weight. Eating 1,500-1,800 calories a day can help most men lose weight. What do I need to know about calorie counting? Work with your health care provider or dietitian to determine how many calories you should get each day. To meet your daily calorie goal, you will need to: Find out how many calories are in each food that you would like to eat. Try to do this before you eat. Decide how much of the food you plan to eat. Keep a food log. Do this by writing down what you ate and how many calories it had. To successfully lose weight, it is important to balance calorie counting with a healthy lifestyle that includes regular activity. Where do I find calorie information?  The number of calories in a food can be found on a Nutrition Facts label. If a food does not have a Nutrition Facts label, try  to look up the calories online or ask your dietitian for help. Remember that calories are listed per serving. If you choose to have more than one serving of a food, you will have to multiply the calories per serving by the number of servings you plan to eat. For example, the label on a package of bread might say that a serving size is 1 slice and that there are 90 calories in a serving. If you eat 1 slice, you will have eaten 90 calories. If you eat 2 slices, you will have eaten 180 calories. How do I keep a food log? After each time that you eat, record the following in your food log as soon as possible: What you ate. Be sure to include toppings, sauces, and other extras on the food. How much you ate. This can be measured in cups, ounces, or number of items. How many calories were in each food and drink. The total number of calories in the food you ate. Keep your food log near you, such as in a pocket-sized notebook or on an app or website on your mobile phone. Some programs will calculate calories for you and show you how many calories you have left to meet your daily goal. What are some portion-control tips? Know how many calories are in a serving. This will help you know how many servings you can have of a certain   food. Use a measuring cup to measure serving sizes. You could also try weighing out portions on a kitchen scale. With time, you will be able to estimate serving sizes for some foods. Take time to put servings of different foods on your favorite plates or in your favorite bowls and cups so you know what a serving looks like. Try not to eat straight from a food's packaging, such as from a bag or box. Eating straight from the package makes it hard to see how much you are eating and can lead to overeating. Put the amount you would like to eat in a cup or on a plate to make sure you are eating the right portion. Use smaller plates, glasses, and bowls for smaller portions and to prevent  overeating. Try not to multitask. For example, avoid watching TV or using your computer while eating. If it is time to eat, sit down at a table and enjoy your food. This will help you recognize when you are full. It will also help you be more mindful of what and how much you are eating. What are tips for following this plan? Reading food labels Check the calorie count compared with the serving size. The serving size may be smaller than what you are used to eating. Check the source of the calories. Try to choose foods that are high in protein, fiber, and vitamins, and low in saturated fat, trans fat, and sodium. Shopping Read nutrition labels while you shop. This will help you make healthy decisions about which foods to buy. Pay attention to nutrition labels for low-fat or fat-free foods. These foods sometimes have the same number of calories or more calories than the full-fat versions. They also often have added sugar, starch, or salt to make up for flavor that was removed with the fat. Make a grocery list of lower-calorie foods and stick to it. Cooking Try to cook your favorite foods in a healthier way. For example, try baking instead of frying. Use low-fat dairy products. Meal planning Use more fruits and vegetables. One-half of your plate should be fruits and vegetables. Include lean proteins, such as chicken, turkey, and fish. Lifestyle Each week, aim to do one of the following: 150 minutes of moderate exercise, such as walking. 75 minutes of vigorous exercise, such as running. General information Know how many calories are in the foods you eat most often. This will help you calculate calorie counts faster. Find a way of tracking calories that works for you. Get creative. Try different apps or programs if writing down calories does not work for you. What foods should I eat?  Eat nutritious foods. It is better to have a nutritious, high-calorie food, such as an avocado, than a food with  few nutrients, such as a bag of potato chips. Use your calories on foods and drinks that will fill you up and will not leave you hungry soon after eating. Examples of foods that fill you up are nuts and nut butters, vegetables, lean proteins, and high-fiber foods such as whole grains. High-fiber foods are foods with more than 5 g of fiber per serving. Pay attention to calories in drinks. Low-calorie drinks include water and unsweetened drinks. The items listed above may not be a complete list of foods and beverages you can eat. Contact a dietitian for more information. What foods should I limit? Limit foods or drinks that are not good sources of vitamins, minerals, or protein or that are high in unhealthy fats. These   include: Candy. Other sweets. Sodas, specialty coffee drinks, alcohol, and juice. The items listed above may not be a complete list of foods and beverages you should avoid. Contact a dietitian for more information. How do I count calories when eating out? Pay attention to portions. Often, portions are much larger when eating out. Try these tips to keep portions smaller: Consider sharing a meal instead of getting your own. If you get your own meal, eat only half of it. Before you start eating, ask for a container and put half of your meal into it. When available, consider ordering smaller portions from the menu instead of full portions. Pay attention to your food and drink choices. Knowing the way food is cooked and what is included with the meal can help you eat fewer calories. If calories are listed on the menu, choose the lower-calorie options. Choose dishes that include vegetables, fruits, whole grains, low-fat dairy products, and lean proteins. Choose items that are boiled, broiled, grilled, or steamed. Avoid items that are buttered, battered, fried, or served with cream sauce. Items labeled as crispy are usually fried, unless stated otherwise. Choose water, low-fat milk,  unsweetened iced tea, or other drinks without added sugar. If you want an alcoholic beverage, choose a lower-calorie option, such as a glass of wine or light beer. Ask for dressings, sauces, and syrups on the side. These are usually high in calories, so you should limit the amount you eat. If you want a salad, choose a garden salad and ask for grilled meats. Avoid extra toppings such as bacon, cheese, or fried items. Ask for the dressing on the side, or ask for olive oil and vinegar or lemon to use as dressing. Estimate how many servings of a food you are given. Knowing serving sizes will help you be aware of how much food you are eating at restaurants. Where to find more information Centers for Disease Control and Prevention: www.cdc.gov [place] Department of Agriculture: myplate.gov Summary Calorie counting means keeping track of how many calories you eat and drink each day. If you eat fewer calories than your body needs, you should lose weight. A healthy amount of weight to lose per week is usually 1-2 lb (0.5-0.9 kg). This usually means reducing your daily calorie intake by 500-750 calories. The number of calories in a food can be found on a Nutrition Facts label. If a food does not have a Nutrition Facts label, try to look up the calories online or ask your dietitian for help. Use smaller plates, glasses, and bowls for smaller portions and to prevent overeating. Use your calories on foods and drinks that will fill you up and not leave you hungry shortly after a meal. This information is not intended to replace advice given to you by your health care provider. Make sure you discuss any questions you have with your health care provider. Document Revised: 02/15/2019 Document Reviewed: 02/15/2019 Elsevier Patient Education  2023 [employer]  

## 2022-07-08 NOTE — Assessment & Plan Note (Signed)
Diet and nutrition discussed Continue simvastatin 80 mg daily.

## 2022-07-08 NOTE — Progress Notes (Signed)
Thayer Headings 67 y.o.   Chief Complaint  Patient presents with   Medical Management of Chronic Issues    Patient has a knot on his head, noticed 2 months ago, gotten bigger in size, not painful, itch     HISTORY OF PRESENT ILLNESS: This is a 67 y.o. male here for 77-month follow-up of chronic medical problems Complaining of skin lesion to his scalp that started about 2 months ago.  Has prior history of the same. Also inquiring about weight loss medication.  Would like to start it Seasonal wheezing.  Needs prescription for inhaler Otherwise doing well.  Has no other complaints or medical concerns today.    HPI   Prior to Admission medications   Medication Sig Start Date End Date Taking? Authorizing Provider  albuterol (VENTOLIN HFA) 108 (90 Base) MCG/ACT inhaler Inhale 2 puffs into the lungs every 6 (six) hours as needed for wheezing or shortness of breath. 04/22/20  Yes Georgina Quint, MD  chlorthalidone (HYGROTON) 25 MG tablet TAKE 1/2 TABLET BY MOUTH EVERY DAY 12/26/21  Yes Shazia Mitchener, Eilleen Kempf, MD  clotrimazole-betamethasone (LOTRISONE) cream Apply 1 application topically 2 (two) times daily. 02/13/19  Yes Lera Gaines, Eilleen Kempf, MD  Menthol, Topical Analgesic, (BIOFREEZE EX) Apply 1 application topically daily as needed (knee pain).   Yes [provider]  sildenafil (VIAGRA) 100 MG tablet Take 0.5-1 tablets (50-100 mg total) by mouth daily as needed for erectile dysfunction. 05/09/22  Yes Alayla Dethlefs, Eilleen Kempf, MD  simvastatin (ZOCOR) 80 MG tablet TAKE 1 TABLET BY MOUTH EVERY DAY AT 6 PM 07/22/21  Yes Divina Neale, Eilleen Kempf, MD  Blood Pressure KIT Take blood pressure reading once a day at different times for the next several weeks. 04/22/20   Georgina Quint, MD  Blood Pressure Monitoring (BLOOD PRESSURE MONITOR/WRIST) DEVI 1 Device by Does not apply route daily. 08/30/13   Maurice March, MD  cetirizine (ZYRTEC) 10 MG tablet Take 1 tablet (10 mg total) by mouth  daily. 05/08/21 05/03/22  Georgina Quint, MD  triamcinolone (NASACORT) 55 MCG/ACT AERO nasal inhaler Place 2 sprays into the nose daily. Patient not taking: Reported on 07/08/2022 04/22/20   Georgina Quint, MD    No Known Allergies  Patient Active Problem List   Diagnosis Date Noted   Hypertensive retinopathy of both eyes 11/27/2020   Impaired ambulation 11/27/2020   Macular pucker, left eye 11/18/2020   Right epiretinal membrane 11/18/2020   Pseudophakia, both eyes 11/18/2020   Essential hypertension 04/25/2018   Seasonal allergies 04/25/2018   Prediabetes 09/26/2017   Bilateral low back pain without sciatica 10/01/2014   Elevated blood-pressure reading without diagnosis of hypertension 10/01/2014   Allergic rhinitis 08/20/2014   Chronic edema 08/20/2014   Other testicular hypofunction 09/26/2013   Erectile dysfunction 08/30/2013   Dyslipidemia 03/04/2013   Obesity, morbid (HCC) 03/04/2013    Past Medical History:  Diagnosis Date   Allergy    Arthritis    Hyperlipidemia     Past Surgical History:  Procedure Laterality Date   HIP SURGERY     KNEE SURGERY     SHOULDER SURGERY      Social History   Socioeconomic History   Marital status: Divorced    Spouse name: Not on file   Number of children: Not on file   Years of education: Not on file   Highest education level: Not on file  Occupational History   Occupation: retired  Tobacco Use   Smoking status: Former  Packs/day: 0.25    Years: 10.00    Additional pack years: 0.00    Total pack years: 2.50    Types: Cigarettes    Quit date: 01/19/1988    Years since quitting: 34.4   Smokeless tobacco: Never  Substance and Sexual Activity   Alcohol use: No   Drug use: No   Sexual activity: Yes  Other Topics Concern   Not on file  Social History Narrative   Divorced; Moved from [place] in 2014   Retired; has 1 son   Social Determinants of Corporate investment banker Strain: High Risk (07/31/2021)    Overall Financial Resource Strain (CARDIA)    Difficulty of Paying Living Expenses: Hard  Food Insecurity: Food Insecurity Present (07/31/2021)   Hunger Vital Sign    Worried About Running Out of Food in the Last Year: Often true    Ran Out of Food in the Last Year: Often true  Transportation Needs: Unmet Transportation Needs (07/31/2021)   PRAPARE - Administrator, Civil Service (Medical): Yes    Lack of Transportation (Non-Medical): Yes  Physical Activity: Sufficiently Active (07/29/2021)   Exercise Vital Sign    Days of Exercise per Week: 7 days    Minutes of Exercise per Session: 30 min  Stress: No Stress Concern Present (07/29/2021)   [employer] of Occupational Health - Occupational Stress Questionnaire    Feeling of Stress : Not at all  Social Connections: Moderately Integrated (07/29/2021)   Social Connection and Isolation Panel [NHANES]    Frequency of Communication with Friends and Family: More than three times a week    Frequency of Social Gatherings with Friends and Family: Once a week    Attends Religious Services: More than 4 times per year    Active Member of [employer] or Organizations: No    Attends Engineer, structural: More than 4 times per year    Marital Status: Divorced  Catering manager Violence: Not At Risk (07/29/2021)   Humiliation, Afraid, Rape, and Kick questionnaire    Fear of Current or Ex-Partner: No    Emotionally Abused: No    Physically Abused: No    Sexually Abused: No    Family History  Problem Relation Age of Onset   Diabetes Mother    Stroke Father    Diabetes Sister    Thyroid disease Neg Hx      Review of Systems  Constitutional: Negative.  Negative for chills and fever.  HENT:  Negative for congestion and sore throat.   Respiratory: Negative.  Negative for cough and shortness of breath.   Cardiovascular: Negative.  Negative for chest pain and palpitations.  Gastrointestinal:  Negative for abdominal pain, nausea and  vomiting.  Genitourinary: Negative.   Skin: Negative.        Scalp skin issue  Neurological: Negative.  Negative for dizziness and headaches.  All other systems reviewed and are negative.   Vitals:   07/08/22 0811  BP: 134/82  Pulse: 77  Temp: 98.4 F (36.9 C)  SpO2: 95%    Physical Exam Vitals reviewed.  Constitutional:      Appearance: He is obese.  HENT:     Head: Normocephalic.  Eyes:     Extraocular Movements: Extraocular movements intact.     Conjunctiva/sclera: Conjunctivae normal.     Pupils: Pupils are equal, round, and reactive to light.  Cardiovascular:     Rate and Rhythm: Normal rate and regular rhythm.  Pulses: Normal pulses.     Heart sounds: Normal heart sounds.  Pulmonary:     Effort: Pulmonary effort is normal.     Breath sounds: Normal breath sounds.  Musculoskeletal:     Cervical back: No tenderness.  Lymphadenopathy:     Cervical: No cervical adenopathy.  Skin:    General: Skin is warm and dry.     Capillary Refill: Capillary refill takes less than 2 seconds.     Findings: Lesion present.     Comments: Scalp skin lesion  Neurological:     Mental Status: He is alert and oriented to person, place, and time.  Psychiatric:        Mood and Affect: Mood normal.        Behavior: Behavior normal.      ASSESSMENT & PLAN: A total of 46 minutes was spent with the patient and counseling/coordination of care regarding preparing for this visit, review of most recent office visit notes, review of multiple chronic medical conditions under management, review of all medications, review of most recent blood work results, cardiovascular risks associated with hypertension and dyslipidemia, education on nutrition, prognosis, documentation, and need for follow-up.   Problem List Items Addressed This Visit       Cardiovascular and Mediastinum   Essential hypertension - Primary    BP Readings from Last 3 Encounters:  07/08/22 134/82  05/19/22 122/81   12/15/21 (!) 158/88  Well-controlled hypertension Continue chlorthalidone 25 mg daily Cardiovascular risks associated with hypertension discussed Dietary approaches to stop hypertension discussed       Relevant Medications   Blood Pressure KIT     Musculoskeletal and Integument   Skin lesion of scalp    Large skin lesion to scalp Needs dermatology evaluation Referral placed today.      Relevant Orders   Ambulatory referral to Dermatology     Other   Dyslipidemia    Diet and nutrition discussed Continue simvastatin 80 mg daily.      Obesity, morbid (HCC)    Diet and nutrition discussed Advised to decrease amount of daily carbohydrate intake and daily calories and increase amount of plant-based protein in his diet Recommend to start weekly Wegovy 0.25 mg as trial.  Will increase if side effects tolerated Will also help decrease chances of cardiovascular disease/complications      Relevant Medications   Semaglutide-Weight Management (WEGOVY) 0.25 MG/0.5ML SOAJ   Prediabetes    Diet and nutrition discussed Cardiovascular risks associated with diabetes discussed      Other Visit Diagnoses     Seasonal asthma       Relevant Medications   albuterol (VENTOLIN HFA) 108 (90 Base) MCG/ACT inhaler   Balanitis       Relevant Medications   clotrimazole-betamethasone (LOTRISONE) cream   At high risk for cardiovascular disease       Relevant Medications   Semaglutide-Weight Management (WEGOVY) 0.25 MG/0.5ML SOAJ      Patient Instructions  Calorie Counting for Weight Loss Calories are units of energy. Your body needs a certain number of calories from food to keep going throughout the day. When you eat or drink more calories than your body needs, your body stores the extra calories mostly as fat. When you eat or drink fewer calories than your body needs, your body burns fat to get the energy it needs. Calorie counting means keeping track of how many calories you eat and  drink each day. Calorie counting can be helpful if you need to  lose weight. If you eat fewer calories than your body needs, you should lose weight. Ask your health care provider what a healthy weight is for you. For calorie counting to work, you will need to eat the right number of calories each day to lose a healthy amount of weight per week. A dietitian can help you figure out how many calories you need in a day and will suggest ways to reach your calorie goal. A healthy amount of weight to lose each week is usually 1-2 lb (0.5-0.9 kg). This usually means that your daily calorie intake should be reduced by 500-750 calories. Eating 1,200-1,500 calories a day can help most women lose weight. Eating 1,500-1,800 calories a day can help most men lose weight. What do I need to know about calorie counting? Work with your health care provider or dietitian to determine how many calories you should get each day. To meet your daily calorie goal, you will need to: Find out how many calories are in each food that you would like to eat. Try to do this before you eat. Decide how much of the food you plan to eat. Keep a food log. Do this by writing down what you ate and how many calories it had. To successfully lose weight, it is important to balance calorie counting with a healthy lifestyle that includes regular activity. Where do I find calorie information?  The number of calories in a food can be found on a Nutrition Facts label. If a food does not have a Nutrition Facts label, try to look up the calories online or ask your dietitian for help. Remember that calories are listed per serving. If you choose to have more than one serving of a food, you will have to multiply the calories per serving by the number of servings you plan to eat. For example, the label on a package of bread might say that a serving size is 1 slice and that there are 90 calories in a serving. If you eat 1 slice, you will have eaten 90  calories. If you eat 2 slices, you will have eaten 180 calories. How do I keep a food log? After each time that you eat, record the following in your food log as soon as possible: What you ate. Be sure to include toppings, sauces, and other extras on the food. How much you ate. This can be measured in cups, ounces, or number of items. How many calories were in each food and drink. The total number of calories in the food you ate. Keep your food log near you, such as in a pocket-sized notebook or on an app or website on your mobile phone. Some programs will calculate calories for you and show you how many calories you have left to meet your daily goal. What are some portion-control tips? Know how many calories are in a serving. This will help you know how many servings you can have of a certain food. Use a measuring cup to measure serving sizes. You could also try weighing out portions on a kitchen scale. With time, you will be able to estimate serving sizes for some foods. Take time to put servings of different foods on your favorite plates or in your favorite bowls and cups so you know what a serving looks like. Try not to eat straight from a food's packaging, such as from a bag or box. Eating straight from the package makes it hard to see how much you are eating  and can lead to overeating. Put the amount you would like to eat in a cup or on a plate to make sure you are eating the right portion. Use smaller plates, glasses, and bowls for smaller portions and to prevent overeating. Try not to multitask. For example, avoid watching TV or using your computer while eating. If it is time to eat, sit down at a table and enjoy your food. This will help you recognize when you are full. It will also help you be more mindful of what and how much you are eating. What are tips for following this plan? Reading food labels Check the calorie count compared with the serving size. The serving size may be smaller  than what you are used to eating. Check the source of the calories. Try to choose foods that are high in protein, fiber, and vitamins, and low in saturated fat, trans fat, and sodium. Shopping Read nutrition labels while you shop. This will help you make healthy decisions about which foods to buy. Pay attention to nutrition labels for low-fat or fat-free foods. These foods sometimes have the same number of calories or more calories than the full-fat versions. They also often have added sugar, starch, or salt to make up for flavor that was removed with the fat. Make a grocery list of lower-calorie foods and stick to it. Cooking Try to cook your favorite foods in a healthier way. For example, try baking instead of frying. Use low-fat dairy products. Meal planning Use more fruits and vegetables. One-half of your plate should be fruits and vegetables. Include lean proteins, such as chicken, [place], and fish. Lifestyle Each week, aim to do one of the following: 150 minutes of moderate exercise, such as walking. 75 minutes of vigorous exercise, such as running. General information Know how many calories are in the foods you eat most often. This will help you calculate calorie counts faster. Find a way of tracking calories that works for you. Get creative. Try different apps or programs if writing down calories does not work for you. What foods should I eat?  Eat nutritious foods. It is better to have a nutritious, high-calorie food, such as an avocado, than a food with few nutrients, such as a bag of potato chips. Use your calories on foods and drinks that will fill you up and will not leave you hungry soon after eating. Examples of foods that fill you up are nuts and nut butters, vegetables, lean proteins, and high-fiber foods such as whole grains. High-fiber foods are foods with more than 5 g of fiber per serving. Pay attention to calories in drinks. Low-calorie drinks include water and  unsweetened drinks. The items listed above may not be a complete list of foods and beverages you can eat. Contact a dietitian for more information. What foods should I limit? Limit foods or drinks that are not good sources of vitamins, minerals, or protein or that are high in unhealthy fats. These include: Candy. Other sweets. Sodas, specialty coffee drinks, alcohol, and juice. The items listed above may not be a complete list of foods and beverages you should avoid. Contact a dietitian for more information. How do I count calories when eating out? Pay attention to portions. Often, portions are much larger when eating out. Try these tips to keep portions smaller: Consider sharing a meal instead of getting your own. If you get your own meal, eat only half of it. Before you start eating, ask for a container and put  half of your meal into it. When available, consider ordering smaller portions from the menu instead of full portions. Pay attention to your food and drink choices. Knowing the way food is cooked and what is included with the meal can help you eat fewer calories. If calories are listed on the menu, choose the lower-calorie options. Choose dishes that include vegetables, fruits, whole grains, low-fat dairy products, and lean proteins. Choose items that are boiled, broiled, grilled, or steamed. Avoid items that are buttered, battered, fried, or served with cream sauce. Items labeled as crispy are usually fried, unless stated otherwise. Choose water, low-fat milk, unsweetened iced tea, or other drinks without added sugar. If you want an alcoholic beverage, choose a lower-calorie option, such as a glass of wine or light beer. Ask for dressings, sauces, and syrups on the side. These are usually high in calories, so you should limit the amount you eat. If you want a salad, choose a garden salad and ask for grilled meats. Avoid extra toppings such as bacon, cheese, or fried items. Ask for the  dressing on the side, or ask for olive oil and vinegar or lemon to use as dressing. Estimate how many servings of a food you are given. Knowing serving sizes will help you be aware of how much food you are eating at restaurants. Where to find more information Centers for Disease Control and Prevention: FootballExhibition.com.br U.S. Department of Agriculture: WrestlingReporter.dk Summary Calorie counting means keeping track of how many calories you eat and drink each day. If you eat fewer calories than your body needs, you should lose weight. A healthy amount of weight to lose per week is usually 1-2 lb (0.5-0.9 kg). This usually means reducing your daily calorie intake by 500-750 calories. The number of calories in a food can be found on a Nutrition Facts label. If a food does not have a Nutrition Facts label, try to look up the calories online or ask your dietitian for help. Use smaller plates, glasses, and bowls for smaller portions and to prevent overeating. Use your calories on foods and drinks that will fill you up and not leave you hungry shortly after a meal. This information is not intended to replace advice given to you by your health care provider. Make sure you discuss any questions you have with your health care provider. Document Revised: 02/15/2019 Document Reviewed: 02/15/2019 Elsevier Patient Education  2023 Elsevier Inc.      Edwina Barth, MD [facility]

## 2022-07-08 NOTE — Assessment & Plan Note (Signed)
Diet and nutrition discussed. Cardiovascular risks associated with diabetes discussed 

## 2022-07-08 NOTE — Telephone Encounter (Signed)
New prescription for his EpiPen sent to pharmacy of record.  Thanks.

## 2022-07-08 NOTE — Assessment & Plan Note (Signed)
Diet and nutrition discussed Advised to decrease amount of daily carbohydrate intake and daily calories and increase amount of plant-based protein in his diet Recommend to start weekly Wegovy 0.25 mg as trial.  Will increase if side effects tolerated Will also help decrease chances of cardiovascular disease/complications

## 2022-07-09 ENCOUNTER — Other Ambulatory Visit: Payer: Self-pay | Admitting: Emergency Medicine

## 2022-07-09 DIAGNOSIS — E785 Hyperlipidemia, unspecified: Secondary | ICD-10-CM

## 2022-07-12 ENCOUNTER — Other Ambulatory Visit (HOSPITAL_COMMUNITY): Payer: Self-pay

## 2022-07-12 ENCOUNTER — Telehealth: Payer: Self-pay | Admitting: Emergency Medicine

## 2022-07-12 ENCOUNTER — Telehealth: Payer: Self-pay

## 2022-07-12 NOTE — Telephone Encounter (Signed)
Patient called to check on the status of his dermatology referral. He said he was told to give [place] a call back if he hadn't heard from them by 07/09/22.  He also would like a call back from a nurse about Wegovy. He said his insurance does not cover it and he would like to discuss what to do.  Best callback is [phone].

## 2022-07-12 NOTE — Telephone Encounter (Signed)
Pharmacy Patient Advocate Encounter   Received notification that prior authorization for Zepbound 2.5mg /0.61ml is required/requested.   PA submitted to  [facility]  via CoverMyMeds Key # [id number] Status is pending

## 2022-07-13 NOTE — Telephone Encounter (Signed)
PA has been DENIED!  

## 2022-07-14 ENCOUNTER — Telehealth: Payer: Self-pay

## 2022-07-14 ENCOUNTER — Telehealth: Payer: Self-pay | Admitting: Emergency Medicine

## 2022-07-14 NOTE — Telephone Encounter (Signed)
Pharmacy Patient Advocate Encounter  Received notification from [employer] that the request for prior authorization for Zepbound has been denied due to Your plan's Medicare Part D drug plan cannot cover drugs used for loss of appetite (anorexia), weight loss, or weight gain. .     There is a duplicate telephone encounter of the denial.

## 2022-07-14 NOTE — Telephone Encounter (Signed)
Pt called wanting to speak to a nurse about Zepbound or even if there any alternatives that he can use that his insurance will cover. Please advise.

## 2022-07-14 NOTE — Telephone Encounter (Signed)
Pt needs PA for Zepbound

## 2022-07-15 NOTE — Telephone Encounter (Signed)
Thank you :)

## 2022-07-15 NOTE — Telephone Encounter (Signed)
Called patient and informed him of provider recommendation. Placed referral to medical weight management

## 2022-07-15 NOTE — Telephone Encounter (Signed)
Eat better and exercise more.  Recommend referral to medical weight management clinic.

## 2022-08-12 DIAGNOSIS — M1711 Unilateral primary osteoarthritis, right knee: Secondary | ICD-10-CM | POA: Diagnosis not present

## 2022-08-17 ENCOUNTER — Ambulatory Visit: Payer: Medicare HMO | Admitting: Podiatry

## 2022-08-24 ENCOUNTER — Encounter (INDEPENDENT_AMBULATORY_CARE_PROVIDER_SITE_OTHER): Payer: Medicare HMO | Admitting: Physician Assistant

## 2022-08-31 DIAGNOSIS — M1711 Unilateral primary osteoarthritis, right knee: Secondary | ICD-10-CM | POA: Diagnosis not present

## 2022-09-07 DIAGNOSIS — M1711 Unilateral primary osteoarthritis, right knee: Secondary | ICD-10-CM | POA: Diagnosis not present

## 2022-09-28 DIAGNOSIS — B350 Tinea barbae and tinea capitis: Secondary | ICD-10-CM | POA: Diagnosis not present

## 2022-10-19 DIAGNOSIS — L859 Epidermal thickening, unspecified: Secondary | ICD-10-CM | POA: Diagnosis not present

## 2022-10-22 DIAGNOSIS — M1711 Unilateral primary osteoarthritis, right knee: Secondary | ICD-10-CM | POA: Diagnosis not present

## 2022-11-22 DIAGNOSIS — D485 Neoplasm of uncertain behavior of skin: Secondary | ICD-10-CM | POA: Diagnosis not present

## 2023-01-16 ENCOUNTER — Other Ambulatory Visit: Payer: Self-pay | Admitting: Emergency Medicine

## 2023-01-16 DIAGNOSIS — E785 Hyperlipidemia, unspecified: Secondary | ICD-10-CM

## 2023-01-16 DIAGNOSIS — I10 Essential (primary) hypertension: Secondary | ICD-10-CM

## 2023-02-14 IMAGING — DX DG CHEST 1V
1 series · 1 of 1 positions shown · non-contrast
Comparison: Chest x-ray dated September 10, 2016.

CLINICAL DATA: Preoperative clearance.  No chest complaints.

EXAM:
CHEST  1 VIEW

[chest pa]
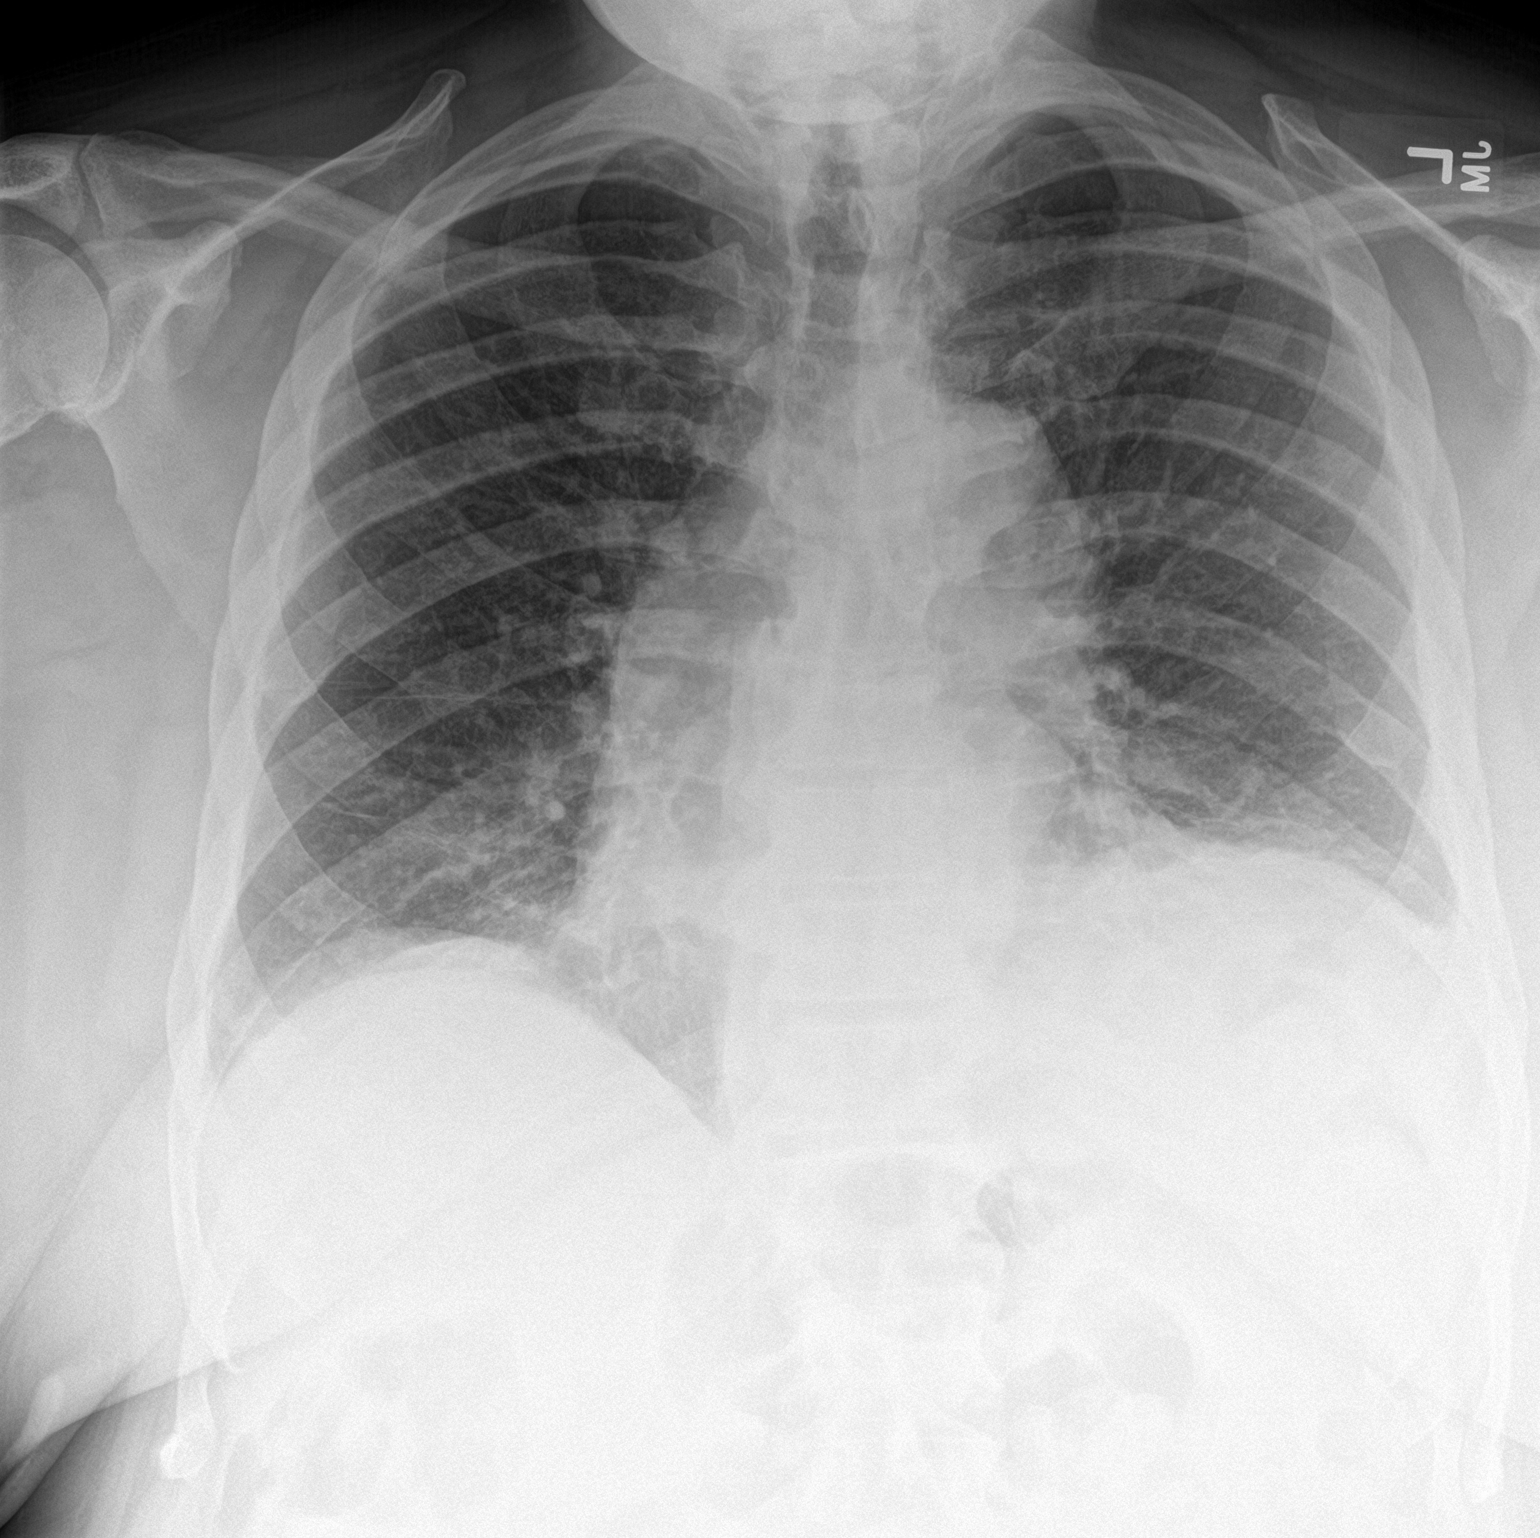

[1 of 1 positions shown; findings below may reference images not displayed]

FINDINGS: The heart remains borderline enlarged. Normal mediastinal contours.
Normal pulmonary vascularity. Unchanged mild bibasilar scarring. No
focal consolidation, pleural effusion, or pneumothorax. Chronic
elevation of left hemidiaphragm. No acute osseous abnormality.
IMPRESSION: 1. No active disease.

## 2023-07-11 ENCOUNTER — Other Ambulatory Visit: Payer: Self-pay | Admitting: Emergency Medicine

## 2023-07-11 DIAGNOSIS — E785 Hyperlipidemia, unspecified: Secondary | ICD-10-CM

## 2023-10-11 ENCOUNTER — Ambulatory Visit: Admitting: Emergency Medicine

## 2023-10-18 ENCOUNTER — Ambulatory Visit: Admitting: Emergency Medicine

## 2023-10-19 ENCOUNTER — Ambulatory Visit (INDEPENDENT_AMBULATORY_CARE_PROVIDER_SITE_OTHER)

## 2023-10-19 VITALS — Ht 74.0 in | Wt 342.0 lb

## 2023-10-19 DIAGNOSIS — Z0000 Encounter for general adult medical examination without abnormal findings: Secondary | ICD-10-CM

## 2023-10-19 NOTE — Progress Notes (Signed)
 Subjective:   Nathan Fleming is a 68 y.o. who presents for a Medicare Wellness preventive visit.  As a reminder, Annual Wellness Visits don't include a physical exam, and some assessments may be limited, especially if this visit is performed virtually. We may recommend an in-person follow-up visit with your provider if needed.  Visit Complete: Virtual I connected with  Nathan Fleming on 10/19/23 by a audio enabled telemedicine application and verified that I am speaking with the correct person using two identifiers.  Patient Location: Home  Provider Location: Office/Clinic  I discussed the limitations of evaluation and management by telemedicine. The patient expressed understanding and agreed to proceed.  Vital Signs: Because this visit was a virtual/telehealth visit, some criteria may be missing or patient reported. Any vitals not documented were not able to be obtained and vitals that have been documented are patient reported.  Interactive audio and video telecommunications failed. Therefore the visit was completed with audio only.  Persons Participating in Visit: Patient.  AWV Questionnaire: No: Patient Medicare AWV questionnaire was not completed prior to this visit.  Cardiac Risk Factors include: advanced age (>80men, >10 women);dyslipidemia;hypertension;male gender;obesity (BMI >30kg/m2);sedentary lifestyle     Objective:    Today's Vitals   10/19/23 0940 10/19/23 0942  Weight: (!) 342 lb (155.1 kg)   Height: 6' 2 (1.88 m)   PainSc:  8    Body mass index is 43.91 kg/m.     10/19/2023   10:01 AM 07/29/2021    9:50 AM 07/24/2020    8:28 AM 10/11/2013    7:19 PM  Advanced Directives  Does Patient Have a Medical Advance Directive? No No No No   Would patient like information on creating a medical advance directive?  No - Patient declined Yes (MAU/Ambulatory/Procedural Areas - Information given) No - patient declined information      Data saved with a previous flowsheet  row definition    Current Medications (verified) Outpatient Encounter Medications as of 10/19/2023  Medication Sig   albuterol  (VENTOLIN  HFA) 108 (90 Base) MCG/ACT inhaler Inhale 2 puffs into the lungs every 6 (six) hours as needed for wheezing or shortness of breath.   Blood Pressure KIT Take blood pressure reading once a day at different times for the next several weeks.   Blood Pressure Monitoring (BLOOD PRESSURE MONITOR/WRIST) DEVI 1 Device by Does not apply route daily.   cetirizine  (ZYRTEC ) 10 MG tablet Take 1 tablet (10 mg total) by mouth daily.   chlorthalidone  (HYGROTON ) 25 MG tablet TAKE 1/2 TABLET BY MOUTH DAILY   clotrimazole -betamethasone  (LOTRISONE ) cream Apply 1 Application topically 2 (two) times daily.   Menthol, Topical Analgesic, (BIOFREEZE EX) Apply 1 application topically daily as needed (knee pain).   sildenafil  (VIAGRA ) 100 MG tablet Take 0.5-1 tablets (50-100 mg total) by mouth daily as needed for erectile dysfunction.   simvastatin  (ZOCOR ) 80 MG tablet TAKE 1 TABLET BY MOUTH EVERY DAY AT 6 PM   tirzepatide  (ZEPBOUND ) 2.5 MG/0.5ML Pen Inject 2.5 mg into the skin once a week.   triamcinolone  (NASACORT ) 55 MCG/ACT AERO nasal inhaler Place 2 sprays into the nose daily.   No facility-administered encounter medications on file as of 10/19/2023.    Allergies (verified) Patient has no known allergies.   History: Past Medical History:  Diagnosis Date   Allergy    Arthritis    Hyperlipidemia    Past Surgical History:  Procedure Laterality Date   HIP SURGERY     KNEE SURGERY  SHOULDER SURGERY     Family History  Problem Relation Age of Onset   Diabetes Mother    Stroke Father    Diabetes Sister    Thyroid  disease Neg Hx    Social History   Socioeconomic History   Marital status: Divorced    Spouse name: Not on file   Number of children: Not on file   Years of education: Not on file   Highest education level: Not on file  Occupational History    Occupation: retired  Tobacco Use   Smoking status: Former    Current packs/day: 0.00    Average packs/day: 0.3 packs/day for 10.0 years (2.5 ttl pk-yrs)    Types: Cigarettes    Start date: 01/18/1978    Quit date: 01/19/1988    Years since quitting: 35.7   Smokeless tobacco: Never  Substance and Sexual Activity   Alcohol use: No   Drug use: No   Sexual activity: Yes  Other Topics Concern   Not on file  Social History Narrative   Divorced; Moved from [place] in 2014   Retired; has 1 son   Social Drivers of Corporate investment banker Strain: Medium Risk (10/19/2023)   Overall Financial Resource Strain (CARDIA)    Difficulty of Paying Living Expenses: Somewhat hard  Food Insecurity: Food Insecurity Present (10/19/2023)   Hunger Vital Sign    Worried About Running Out of Food in the Last Year: Often true    Ran Out of Food in the Last Year: Often true  Transportation Needs: Unmet Transportation Needs (10/19/2023)   PRAPARE - Administrator, Civil Service (Medical): Yes    Lack of Transportation (Non-Medical): Yes  Physical Activity: Inactive (10/19/2023)   Exercise Vital Sign    Days of Exercise per Week: 0 days    Minutes of Exercise per Session: 0 min  Stress: No Stress Concern Present (10/19/2023)   [employer] of Occupational Health - Occupational Stress Questionnaire    Feeling of Stress: Only a little  Social Connections: Moderately Integrated (10/19/2023)   Social Connection and Isolation Panel    Frequency of Communication with Friends and Family: More than three times a week    Frequency of Social Gatherings with Friends and Family: Once a week    Attends Religious Services: More than 4 times per year    Active Member of [employer] or Organizations: No    Attends Engineer, structural: More than 4 times per year    Marital Status: Divorced    Tobacco Counseling Counseling given: Not Answered   Clinical Intake:  Pre-visit preparation completed:  Yes  Pain : 0-10 Pain Score: 8  Pain Type: Chronic pain Pain Location: Knee Pain Orientation: Right, Left Pain Descriptors / Indicators: Aching Pain Onset: More than a month ago Pain Frequency: Intermittent Pain Relieving Factors: Alieve Effect of Pain on Daily Activities: cannot climb stairs well  Pain Relieving Factors: Alieve  BMI - recorded: 43.91 Nutritional Status: BMI > 30  Obese Nutritional Risks: None Diabetes: No  Lab Results  Component Value Date   HGBA1C 6.0 12/01/2021   HGBA1C 5.8 06/02/2021   HGBA1C 5.7 04/22/2020     How often do you need to have someone help you when you read instructions, pamphlets, or other written materials from your doctor or pharmacy?: 1 - Never  Interpreter Needed?: No  Comments: lives alone Information entered by :: B.Shannelle Alguire,LPN   Activities of Daily Living     10/19/2023  10:02 AM  In your present state of health, do you have any difficulty performing the following activities:  Hearing? 0  Vision? 0  Difficulty concentrating or making decisions? 0  Walking or climbing stairs? 1  Dressing or bathing? 0  Doing errands, shopping? 1  Preparing Food and eating ? N  Using the Toilet? N  In the past six months, have you accidently leaked urine? N  Do you have problems with loss of bowel control? N  Managing your Medications? N  Managing your Finances? N  Housekeeping or managing your Housekeeping? Y    Patient Care Team: Purcell Emil Schanz, MD as PCP - General (Internal Medicine) Delmonte, Carliss ORN, MD as Consulting Physician (Ophthalmology)  I have updated your Care Teams any recent Medical Services you may have received from other providers in the past year.     Assessment:   This is a routine wellness examination for Nathan Fleming.  Hearing/Vision screen Hearing Screening - Comments:: Patient denies any hearing difficulties.   Vision Screening - Comments:: Pt says their vision is good without glasses; wears readers  only;had cataract surgery last year Dr  Heriberto   Goals Addressed             This Visit's Progress    Weight (lb) < 200 lb (90.7 kg)   342 lb (155.1 kg)    I would like to lose 50lb and get on semiglutides       Depression Screen     10/19/2023    9:53 AM 07/08/2022    8:13 AM 12/01/2021    8:07 AM 07/29/2021    9:54 AM 06/02/2021    8:11 AM 11/27/2020   10:16 AM 07/24/2020    8:33 AM  PHQ 2/9 Scores  PHQ - 2 Score 0 0 0 0 0 0 0    Fall Risk     10/19/2023    9:49 AM 07/08/2022    8:13 AM 12/01/2021    8:06 AM 07/29/2021    9:51 AM 06/02/2021    8:11 AM  Fall Risk   Falls in the past year? 0 0 1 1 1   Number falls in past yr: 0 0 0 0 0  Injury with Fall? 0 0 1 1 1   Risk for fall due to : No Fall Risks No Fall Risks Impaired balance/gait    Follow up Falls prevention discussed;Education provided Falls evaluation completed Falls evaluation completed  Falls prevention discussed       Data saved with a previous flowsheet row definition    MEDICARE RISK AT HOME:  Medicare Risk at Home Any stairs in or around the home?: Yes If so, are there any without handrails?: Yes Home free of loose throw rugs in walkways, pet beds, electrical cords, etc?: Yes Adequate lighting in your home to reduce risk of falls?: Yes Life alert?: No Use of a cane, walker or w/c?: Yes Grab bars in the bathroom?: Yes Shower chair or bench in shower?: No Elevated toilet seat or a handicapped toilet?: No  TIMED UP AND GO:  Was the test performed?  No  Cognitive Function: 6CIT completed        10/19/2023   10:11 AM 07/29/2021   10:07 AM  6CIT Screen  What Year? 0 points 0 points  What month? 0 points 0 points  What time? 0 points 0 points  Count back from 20 0 points 0 points  Months in reverse 0 points 0 points  Repeat phrase 0  points 0 points  Total Score 0 points 0 points    Immunizations Immunization History  Administered Date(s) Administered   Fluad Quad(high Dose 65+)  11/27/2020, 12/01/2021, 11/15/2022   Influenza,inj,Quad PF,6+ Mos 03/02/2013, 10/01/2014, 01/03/2016, 09/10/2016, 12/29/2017, 10/14/2018   Influenza-Unspecified 10/31/2019   Janssen (J&J) SARS-COV-2 Vaccination 04/06/2019   PFIZER(Purple Top)SARS-COV-2 Vaccination 01/01/2020   PNEUMOCOCCAL CONJUGATE-20 12/01/2021   Pneumococcal Conjugate-13 04/22/2020   Tdap 09/10/2016   Unspecified SARS-COV-2 Vaccination 11/15/2022    Screening Tests Health Maintenance  Topic Date Due   Colonoscopy  Never done   Hepatitis C Screening  Never done   Zoster Vaccines- Shingrix (1 of 2) Never done   Influenza Vaccine  08/19/2023   COVID-19 Vaccine (4 - 2024-25 season) 09/19/2023   Medicare Annual Wellness (AWV)  10/18/2024   DTaP/Tdap/Td (2 - Td or Tdap) 09/11/2026   Pneumococcal Vaccine: 50+ Years  Completed   HPV VACCINES  Aged Out   Meningococcal B Vaccine  Aged Out    Health Maintenance Items Addressed: Pt declines referral for colonoscopy None due at this time. Pt will receive vaccines at their pharmcy when decided to obtain   Additional Screening:  Vision Screening: Recommended annual ophthalmology exams for early detection of glaucoma and other disorders of the eye. Is the patient up to date with their annual eye exam?  Yes  Who is the provider or what is the name of the office in which the patient attends annual eye exams? Dr Leane  Dental Screening: Recommended annual dental exams for proper oral hygiene  Community Resource Referral / Chronic Care Management: CRR required this visit?  Yes Find help resources included in pt AVS (pt aware)  CCM required this visit?  No   Plan:    I have personally reviewed and noted the following in the patient's chart:   Medical and social history Use of alcohol, tobacco or illicit drugs  Current medications and supplements including opioid prescriptions. Patient is not currently taking opioid prescriptions. Functional ability and  status Nutritional status Physical activity Advanced directives List of other physicians Hospitalizations, surgeries, and ER visits in previous 12 months Vitals Screenings to include cognitive, depression, and falls Referrals and appointments  In addition, I have reviewed and discussed with patient certain preventive protocols, quality metrics, and best practice recommendations. A written personalized care plan for preventive services as well as general preventive health recommendations were provided to patient.   Erminio LITTIE Saris, LPN   89/08/7972   After Visit Summary: (MyChart) Due to this being a telephonic visit, the after visit summary with patients personalized plan was offered to patient via MyChart   Notes: Nothing significant to report at this time.

## 2023-10-19 NOTE — Patient Instructions (Addendum)
 Nathan Fleming,  Thank you for taking the time for your Medicare Wellness Visit. I appreciate your continued commitment to your health goals. Please review the care plan we discussed, and feel free to reach out if I can assist you further.  Medicare recommends these wellness visits once per year to help you and your care team stay ahead of potential health issues. These visits are designed to focus on prevention, allowing your provider to concentrate on managing your acute and chronic conditions during your regular appointments.  Please note that Annual Wellness Visits do not include a physical exam. Some assessments may be limited, especially if the visit was conducted virtually. If needed, we may recommend a separate in-person follow-up with your provider.  Ongoing Care Seeing your primary care provider every 3 to 6 months helps us  monitor your health and provide consistent, personalized care.   Referrals If a referral was made during today's visit and you haven't received any updates within two weeks, please contact the referred provider directly to check on the status.  TRANSPORTION NEED  [email or web address]  [employer] [facility]    Recommended Screenings:  Health Maintenance  Topic Date Due   Colon Cancer Screening  Never done   Hepatitis C Screening  Never done   Zoster (Shingles) Vaccine (1 of 2) Never done   Flu Shot  08/19/2023   COVID-19 Vaccine (4 - 2024-25 season) 09/19/2023   Medicare Annual Wellness Visit  10/18/2024   DTaP/Tdap/Td vaccine (2 - Td or Tdap) 09/11/2026   Pneumococcal Vaccine for age over 70  Completed   HPV Vaccine  Aged Out   Meningitis B Vaccine  Aged Out       07/29/2021    9:50 AM  Advanced Directives  Does Patient Have a Medical  Advance Directive? No  Would patient like information on creating a medical advance directive? No - Patient declined   Advance Care Planning is important because it: Ensures you receive medical care that aligns with your values, goals, and preferences. Provides guidance to your family and loved ones, reducing the emotional burden of decision-making during critical moments.  Vision: Annual vision screenings are recommended for early detection of glaucoma, cataracts, and diabetic retinopathy. These exams can also reveal signs of chronic conditions such as diabetes and high blood pressure.  Dental: Annual dental screenings help detect early signs of oral cancer, gum disease, and other conditions linked to overall health, including heart disease and diabetes.  Please see the attached documents for additional preventive care recommendations.

## 2023-10-20 ENCOUNTER — Ambulatory Visit: Payer: Self-pay | Admitting: Emergency Medicine

## 2023-10-20 ENCOUNTER — Telehealth: Payer: Self-pay

## 2023-10-20 ENCOUNTER — Encounter: Payer: Self-pay | Admitting: Emergency Medicine

## 2023-10-20 ENCOUNTER — Ambulatory Visit (INDEPENDENT_AMBULATORY_CARE_PROVIDER_SITE_OTHER): Admitting: Emergency Medicine

## 2023-10-20 VITALS — BP 132/78 | HR 79 | Temp 98.0°F | Ht 74.0 in | Wt 348.0 lb

## 2023-10-20 DIAGNOSIS — Z6841 Body Mass Index (BMI) 40.0 and over, adult: Secondary | ICD-10-CM

## 2023-10-20 DIAGNOSIS — I10 Essential (primary) hypertension: Secondary | ICD-10-CM | POA: Diagnosis not present

## 2023-10-20 DIAGNOSIS — R7303 Prediabetes: Secondary | ICD-10-CM

## 2023-10-20 DIAGNOSIS — Z23 Encounter for immunization: Secondary | ICD-10-CM | POA: Diagnosis not present

## 2023-10-20 DIAGNOSIS — E785 Hyperlipidemia, unspecified: Secondary | ICD-10-CM

## 2023-10-20 DIAGNOSIS — Z9189 Other specified personal risk factors, not elsewhere classified: Secondary | ICD-10-CM

## 2023-10-20 DIAGNOSIS — Z125 Encounter for screening for malignant neoplasm of prostate: Secondary | ICD-10-CM

## 2023-10-20 LAB — CBC WITH DIFFERENTIAL/PLATELET
Basophils Absolute: 0 K/uL (ref 0.0–0.1)
Basophils Relative: 0.3 % (ref 0.0–3.0)
Eosinophils Absolute: 0 K/uL (ref 0.0–0.7)
Eosinophils Relative: 0 % (ref 0.0–5.0)
HCT: 46.4 % (ref 39.0–52.0)
Hemoglobin: 15.1 g/dL (ref 13.0–17.0)
Lymphocytes Relative: 41.9 % (ref 12.0–46.0)
Lymphs Abs: 2.2 K/uL (ref 0.7–4.0)
MCHC: 32.5 g/dL (ref 30.0–36.0)
MCV: 89.7 fl (ref 78.0–100.0)
Monocytes Absolute: 0.6 K/uL (ref 0.1–1.0)
Monocytes Relative: 11.2 % (ref 3.0–12.0)
Neutro Abs: 2.5 K/uL (ref 1.4–7.7)
Neutrophils Relative %: 46.6 % (ref 43.0–77.0)
Platelets: 223 K/uL (ref 150.0–400.0)
RBC: 5.18 Mil/uL (ref 4.22–5.81)
RDW: 13.3 % (ref 11.5–15.5)
WBC: 5.3 K/uL (ref 4.0–10.5)

## 2023-10-20 LAB — LIPID PANEL
Cholesterol: 179 mg/dL (ref 0–200)
HDL: 41.2 mg/dL (ref >39.00)
LDL Cholesterol: 111 mg/dL — ABNORMAL HIGH (ref 0–99)
NonHDL: 137.61
Total CHOL/HDL Ratio: 4
Triglycerides: 133 mg/dL (ref 0.0–149.0)
VLDL: 26.6 mg/dL (ref 0.0–40.0)

## 2023-10-20 LAB — COMPREHENSIVE METABOLIC PANEL WITH GFR
ALT: 29 U/L (ref 0–53)
AST: 26 U/L (ref 0–37)
Albumin: 3.8 g/dL (ref 3.5–5.2)
Alkaline Phosphatase: 91 U/L (ref 39–117)
BUN: 11 mg/dL (ref 6–23)
CO2: 32 meq/L (ref 19–32)
Calcium: 9 mg/dL (ref 8.4–10.5)
Chloride: 100 meq/L (ref 96–112)
Creatinine, Ser: 0.95 mg/dL (ref 0.40–1.50)
GFR: 82.02 mL/min (ref >60.00)
Glucose, Bld: 92 mg/dL (ref 70–99)
Potassium: 4.5 meq/L (ref 3.5–5.1)
Sodium: 141 meq/L (ref 135–145)
Total Bilirubin: 0.8 mg/dL (ref 0.2–1.2)
Total Protein: 7.4 g/dL (ref 6.0–8.3)

## 2023-10-20 LAB — PSA: PSA: 1.3 ng/mL (ref 0.10–4.00)

## 2023-10-20 LAB — HEMOGLOBIN A1C: Hgb A1c MFr Bld: 5.9 % (ref 4.6–6.5)

## 2023-10-20 MED ORDER — WEGOVY 0.25 MG/0.5ML ~~LOC~~ SOAJ: 0.2500 mg | SUBCUTANEOUS | 3 refills | Status: DC

## 2023-10-20 NOTE — Telephone Encounter (Signed)
 Copied from CRM #[id number]. Topic: Clinical - Medication Question >> Oct 20, 2023 10:33 AM [place] E wrote: Reason for CRM: Patient stated she was just started on semaglutide -weight management (WEGOVY ) 0.25 MG/0.5ML SOAJ SQ injection, and it would be $1,100 for 28 days of medication, patient questioning if there are any alternatives that his PCP would have to offer. Please call and advise.

## 2023-10-20 NOTE — Assessment & Plan Note (Signed)
Diet and nutrition discussed. Cardiovascular risks associated with diabetes discussed

## 2023-10-20 NOTE — Assessment & Plan Note (Signed)
 Diet and nutrition discussed Continue simvastatin  80 mg daily. Lipid profile done today The 10-year ASCVD risk score (Arnett DK, et al., 2019) is: 19.5%   Values used to calculate the score:     Age: 68 years     Clincally relevant sex: Male     Is Non-Hispanic African American: Yes     Diabetic: No     Tobacco smoker: No     Systolic Blood Pressure: 132 mmHg     Is BP treated: Yes     HDL Cholesterol: 36.4 mg/dL     Total Cholesterol: 168 mg/dL

## 2023-10-20 NOTE — Patient Instructions (Signed)
 Health Maintenance After Age 69 After age 27, you are at a higher risk for certain long-term diseases and infections as well as injuries from falls. Falls are a major cause of broken bones and head injuries in people who are older than age 73. Getting regular preventive care can help to keep you healthy and well. Preventive care includes getting regular testing and making lifestyle changes as recommended by your health care provider. Talk with your health care provider about: Which screenings and tests you should have. A screening is a test that checks for a disease when you have no symptoms. A diet and exercise plan that is right for you. What should I know about screenings and tests to prevent falls? Screening and testing are the best ways to find a health problem early. Early diagnosis and treatment give you the best chance of managing medical conditions that are common after age 90. Certain conditions and lifestyle choices may make you more likely to have a fall. Your health care provider may recommend: Regular vision checks. Poor vision and conditions such as cataracts can make you more likely to have a fall. If you wear glasses, make sure to get your prescription updated if your vision changes. Medicine review. Work with your health care provider to regularly review all of the medicines you are taking, including over-the-counter medicines. Ask your health care provider about any side effects that may make you more likely to have a fall. Tell your health care provider if any medicines that you take make you feel dizzy or sleepy. Strength and balance checks. Your health care provider may recommend certain tests to check your strength and balance while standing, walking, or changing positions. Foot health exam. Foot pain and numbness, as well as not wearing proper footwear, can make you more likely to have a fall. Screenings, including: Osteoporosis screening. Osteoporosis is a condition that causes  the bones to get weaker and break more easily. Blood pressure screening. Blood pressure changes and medicines to control blood pressure can make you feel dizzy. Depression screening. You may be more likely to have a fall if you have a fear of falling, feel depressed, or feel unable to do activities that you used to do. Alcohol  use screening. Using too much alcohol  can affect your balance and may make you more likely to have a fall. Follow these instructions at home: Lifestyle Do not drink alcohol  if: Your health care provider tells you not to drink. If you drink alcohol : Limit how much you have to: 0-1 drink a day for women. 0-2 drinks a day for men. Know how much alcohol  is in your drink. In the [place] one drink equals one 12 oz bottle of beer (355 mL), one 5 oz glass of wine (148 mL), or one 1 oz glass of hard liquor (44 mL). Do not use any products that contain nicotine or tobacco. These products include cigarettes, chewing tobacco, and vaping devices, such as e-cigarettes. If you need help quitting, ask your health care provider. Activity  Follow a regular exercise program to stay fit. This will help you maintain your balance. Ask your health care provider what types of exercise are appropriate for you. If you need a cane or walker, use it as recommended by your health care provider. Wear supportive shoes that have nonskid soles. Safety  Remove any tripping hazards, such as rugs, cords, and clutter. Install safety equipment such as grab bars in bathrooms and safety rails on stairs. Keep rooms and walkways  well-lit. General instructions Talk with your health care provider about your risks for falling. Tell your health care provider if: You fall. Be sure to tell your health care provider about all falls, even ones that seem minor. You feel dizzy, tiredness (fatigue), or off-balance. Take over-the-counter and prescription medicines only as told by your health care provider. These include  supplements. Eat a healthy diet and maintain a healthy weight. A healthy diet includes low-fat dairy products, low-fat (lean) meats, and fiber from whole grains, beans, and lots of fruits and vegetables. Stay current with your vaccines. Schedule regular health, dental, and eye exams. Summary Having a healthy lifestyle and getting preventive care can help to protect your health and wellness after age 15. Screening and testing are the best way to find a health problem early and help you avoid having a fall. Early diagnosis and treatment give you the best chance for managing medical conditions that are more common for people who are older than age 42. Falls are a major cause of broken bones and head injuries in people who are older than age 64. Take precautions to prevent a fall at home. Work with your health care provider to learn what changes you can make to improve your health and wellness and to prevent falls. This information is not intended to replace advice given to you by your health care provider. Make sure you discuss any questions you have with your health care provider. Document Revised: 05/26/2020 Document Reviewed: 05/26/2020 Elsevier Patient Education  2024 [employer].

## 2023-10-20 NOTE — Assessment & Plan Note (Signed)
Diet and nutrition discussed Advised to decrease amount of daily carbohydrate intake and daily calories and increase amount of plant-based protein in his diet Recommend to start weekly Wegovy 0.25 mg as trial.  Will increase if side effects tolerated Will also help decrease chances of cardiovascular disease/complications

## 2023-10-20 NOTE — Progress Notes (Signed)
 Nathan Fleming 68 y.o.   Chief Complaint  Patient presents with   Follow-up    Patient here for F/u states its been awhile seen he has been seen. LOV 07/08/2022    HISTORY OF PRESENT ILLNESS: This is a 68 y.o. male here for follow-up of chronic medical conditions including hypertension and dyslipidemia. Has no complaints or medical concerns today. Needs colon cancer screening.  States he just received Cologuard box test last Monday.  HPI   Prior to Admission medications   Medication Sig Start Date End Date Taking? Authorizing Provider  albuterol  (VENTOLIN  HFA) 108 (90 Base) MCG/ACT inhaler Inhale 2 puffs into the lungs every 6 (six) hours as needed for wheezing or shortness of breath. 07/08/22  Yes Miral Hoopes, Emil Schanz, MD  Blood Pressure KIT Take blood pressure reading once a day at different times for the next several weeks. 07/08/22  Yes Benny Deutschman, Emil Schanz, MD  Blood Pressure Monitoring (BLOOD PRESSURE MONITOR/WRIST) DEVI 1 Device by Does not apply route daily. 07/08/22  Yes Purcell Emil Schanz, MD  chlorthalidone  (HYGROTON ) 25 MG tablet TAKE 1/2 TABLET BY MOUTH DAILY 01/17/23  Yes Daziah Hesler Jose, MD  clotrimazole -betamethasone  (LOTRISONE ) cream Apply 1 Application topically 2 (two) times daily. 07/08/22  Yes Tamanna Whitson Jose, MD  Menthol, Topical Analgesic, (BIOFREEZE EX) Apply 1 application topically daily as needed (knee pain).   Yes [provider]  sildenafil  (VIAGRA ) 100 MG tablet Take 0.5-1 tablets (50-100 mg total) by mouth daily as needed for erectile dysfunction. 05/09/22  Yes Nikoli Nasser, Emil Schanz, MD  simvastatin  (ZOCOR ) 80 MG tablet TAKE 1 TABLET BY MOUTH EVERY DAY AT 6 PM 07/11/23  Yes Ashwini Jago, Emil Schanz, MD  terbinafine  (LAMISIL ) 250 MG tablet Take 1 tablet by mouth daily. 10/19/22  Yes [provider]  triamcinolone  (NASACORT ) 55 MCG/ACT AERO nasal inhaler Place 2 sprays into the nose daily. 04/22/20  Yes Ziyana Morikawa, Emil Schanz, MD   cetirizine  (ZYRTEC ) 10 MG tablet Take 1 tablet (10 mg total) by mouth daily. Patient not taking: Reported on 10/20/2023 05/08/21 10/19/23  Onaje Warne Jose, MD  tirzepatide  (ZEPBOUND ) 2.5 MG/0.5ML Pen Inject 2.5 mg into the skin once a week. Patient not taking: Reported on 10/20/2023 07/08/22   Purcell Emil Schanz, MD    No Known Allergies  Patient Active Problem List   Diagnosis Date Noted   Skin lesion of scalp 07/08/2022   Hypertensive retinopathy of both eyes 11/27/2020   Impaired ambulation 11/27/2020   Macular pucker, left eye 11/18/2020   Essential hypertension 04/25/2018   Prediabetes 09/26/2017   Chronic edema 08/20/2014   Erectile dysfunction 08/30/2013   Dyslipidemia 03/04/2013   Obesity, morbid (HCC) 03/04/2013    Past Medical History:  Diagnosis Date   Allergy    Arthritis    Hyperlipidemia     Past Surgical History:  Procedure Laterality Date   HIP SURGERY     KNEE SURGERY     SHOULDER SURGERY      Social History   Socioeconomic History   Marital status: Divorced    Spouse name: Not on file   Number of children: Not on file   Years of education: Not on file   Highest education level: Not on file  Occupational History   Occupation: retired  Tobacco Use   Smoking status: Former    Current packs/day: 0.00    Average packs/day: 0.3 packs/day for 10.0 years (2.5 ttl pk-yrs)    Types: Cigarettes    Start date: 01/18/1978  Quit date: 01/19/1988    Years since quitting: 35.7   Smokeless tobacco: Never  Substance and Sexual Activity   Alcohol use: No   Drug use: No   Sexual activity: Yes  Other Topics Concern   Not on file  Social History Narrative   Divorced; Moved from [place] in 2014   Retired; has 1 son   Social Drivers of Corporate investment banker Strain: Medium Risk (10/19/2023)   Overall Financial Resource Strain (CARDIA)    Difficulty of Paying Living Expenses: Somewhat hard  Food Insecurity: Food Insecurity Present (10/19/2023)    Hunger Vital Sign    Worried About Running Out of Food in the Last Year: Often true    Ran Out of Food in the Last Year: Often true  Transportation Needs: Unmet Transportation Needs (10/19/2023)   PRAPARE - Administrator, Civil Service (Medical): Yes    Lack of Transportation (Non-Medical): Yes  Physical Activity: Inactive (10/19/2023)   Exercise Vital Sign    Days of Exercise per Week: 0 days    Minutes of Exercise per Session: 0 min  Stress: No Stress Concern Present (10/19/2023)   [employer] of Occupational Health - Occupational Stress Questionnaire    Feeling of Stress: Only a little  Social Connections: Moderately Integrated (10/19/2023)   Social Connection and Isolation Panel    Frequency of Communication with Friends and Family: More than three times a week    Frequency of Social Gatherings with Friends and Family: Once a week    Attends Religious Services: More than 4 times per year    Active Member of [employer] or Organizations: No    Attends Engineer, structural: More than 4 times per year    Marital Status: Divorced  Catering manager Violence: Not At Risk (10/19/2023)   Humiliation, Afraid, Rape, and Kick questionnaire    Fear of Current or Ex-Partner: No    Emotionally Abused: No    Physically Abused: No    Sexually Abused: No    Family History  Problem Relation Age of Onset   Diabetes Mother    Stroke Father    Diabetes Sister    Thyroid  disease Neg Hx      Review of Systems  Constitutional: Negative.  Negative for chills and fever.  HENT: Negative.  Negative for congestion and sore throat.   Respiratory: Negative.  Negative for cough and shortness of breath.   Cardiovascular: Negative.  Negative for chest pain and palpitations.  Gastrointestinal:  Negative for abdominal pain, diarrhea, nausea and vomiting.  Genitourinary: Negative.  Negative for dysuria and hematuria.  Skin: Negative.  Negative for rash.  Neurological: Negative.   Negative for dizziness and headaches.  All other systems reviewed and are negative.   Today's Vitals   10/20/23 0848  BP: 132/78  Pulse: 79  Temp: 98 F (36.7 C)  TempSrc: Oral  SpO2: 97%  Weight: (!) 348 lb (157.9 kg)  Height: 6' 2 (1.88 m)   Body mass index is 44.68 kg/m.   Physical Exam Vitals reviewed.  Constitutional:      Appearance: Normal appearance. He is obese.  HENT:     Head: Normocephalic.     Mouth/Throat:     Mouth: Mucous membranes are moist.     Pharynx: Oropharynx is clear.  Eyes:     Extraocular Movements: Extraocular movements intact.     Pupils: Pupils are equal, round, and reactive to light.  Cardiovascular:  Rate and Rhythm: Normal rate and regular rhythm.     Pulses: Normal pulses.     Heart sounds: Normal heart sounds.  Pulmonary:     Effort: Pulmonary effort is normal.     Breath sounds: Normal breath sounds.  Musculoskeletal:     Cervical back: No tenderness.  Lymphadenopathy:     Cervical: No cervical adenopathy.  Skin:    General: Skin is warm and dry.     Capillary Refill: Capillary refill takes less than 2 seconds.  Neurological:     General: No focal deficit present.     Mental Status: He is alert and oriented to person, place, and time.  Psychiatric:        Mood and Affect: Mood normal.        Behavior: Behavior normal.      ASSESSMENT & PLAN: A total of 44 minutes was spent with the patient and counseling/coordination of care regarding preparing for this visit, review of most recent office visit notes, review of multiple chronic medical conditions and their management, cardiovascular risks associated with hypertension and obesity, review of all medications and changes made, review of most recent bloodwork results, review of health maintenance items, education on nutrition, prognosis, documentation, and need for follow up.   Problem List Items Addressed This Visit       Cardiovascular and Mediastinum   Essential  hypertension - Primary   BP Readings from Last 3 Encounters:  10/20/23 132/78  07/08/22 134/82  05/19/22 122/81  Well-controlled hypertension Continue chlorthalidone  25 mg daily Cardiovascular risks associated with hypertension discussed Dietary approaches to stop hypertension discussed       Relevant Orders   CBC with Differential/Platelet   Comprehensive metabolic panel with GFR   Hemoglobin A1c   Lipid panel     Other   Dyslipidemia   Diet and nutrition discussed Continue simvastatin  80 mg daily. Lipid profile done today The 10-year ASCVD risk score (Arnett DK, et al., 2019) is: 19.5%   Values used to calculate the score:     Age: 35 years     Clincally relevant sex: Male     Is Non-Hispanic African American: Yes     Diabetic: No     Tobacco smoker: No     Systolic Blood Pressure: 132 mmHg     Is BP treated: Yes     HDL Cholesterol: 36.4 mg/dL     Total Cholesterol: 168 mg/dL       Relevant Orders   Comprehensive metabolic panel with GFR   Lipid panel   Obesity, morbid (HCC)   Diet and nutrition discussed Advised to decrease amount of daily carbohydrate intake and daily calories and increase amount of plant-based protein in his diet Recommend to start weekly Wegovy  0.25 mg as trial.  Will increase if side effects tolerated Will also help decrease chances of cardiovascular disease/complications      Relevant Medications   semaglutide -weight management (WEGOVY ) 0.25 MG/0.5ML SOAJ SQ injection   Prediabetes   Diet and nutrition discussed Cardiovascular risks associated with diabetes discussed      Relevant Orders   Comprehensive metabolic panel with GFR   Hemoglobin A1c   Other Visit Diagnoses       Morbid obesity with body mass index (BMI) of 45.0 to 49.9 in adult [facility])       Relevant Medications   semaglutide -weight management (WEGOVY ) 0.25 MG/0.5ML SOAJ SQ injection   Other Relevant Orders   Comprehensive metabolic panel with GFR  Hemoglobin A1c    Lipid panel     Need for vaccination       Relevant Orders   Flu vaccine HIGH DOSE PF(Fluzone Trivalent)     Screening for prostate cancer       Relevant Orders   PSA     At risk for cardiovascular event       Relevant Medications   semaglutide -weight management (WEGOVY ) 0.25 MG/0.5ML SOAJ SQ injection      Patient Instructions  Health Maintenance After Age 75 After age 57, you are at a higher risk for certain long-term diseases and infections as well as injuries from falls. Falls are a major cause of broken bones and head injuries in people who are older than age 43. Getting regular preventive care can help to keep you healthy and well. Preventive care includes getting regular testing and making lifestyle changes as recommended by your health care provider. Talk with your health care provider about: Which screenings and tests you should have. A screening is a test that checks for a disease when you have no symptoms. A diet and exercise plan that is right for you. What should I know about screenings and tests to prevent falls? Screening and testing are the best ways to find a health problem early. Early diagnosis and treatment give you the best chance of managing medical conditions that are common after age 49. Certain conditions and lifestyle choices may make you more likely to have a fall. Your health care provider may recommend: Regular vision checks. Poor vision and conditions such as cataracts can make you more likely to have a fall. If you wear glasses, make sure to get your prescription updated if your vision changes. Medicine review. Work with your health care provider to regularly review all of the medicines you are taking, including over-the-counter medicines. Ask your health care provider about any side effects that may make you more likely to have a fall. Tell your health care provider if any medicines that you take make you feel dizzy or sleepy. Strength and balance checks. Your  health care provider may recommend certain tests to check your strength and balance while standing, walking, or changing positions. Foot health exam. Foot pain and numbness, as well as not wearing proper footwear, can make you more likely to have a fall. Screenings, including: Osteoporosis screening. Osteoporosis is a condition that causes the bones to get weaker and break more easily. Blood pressure screening. Blood pressure changes and medicines to control blood pressure can make you feel dizzy. Depression screening. You may be more likely to have a fall if you have a fear of falling, feel depressed, or feel unable to do activities that you used to do. Alcohol use screening. Using too much alcohol can affect your balance and may make you more likely to have a fall. Follow these instructions at home: Lifestyle Do not drink alcohol if: Your health care provider tells you not to drink. If you drink alcohol: Limit how much you have to: 0-1 drink a day for women. 0-2 drinks a day for men. Know how much alcohol is in your drink. In the [place] one drink equals one 12 oz bottle of beer (355 mL), one 5 oz glass of wine (148 mL), or one 1 oz glass of hard liquor (44 mL). Do not use any products that contain nicotine or tobacco. These products include cigarettes, chewing tobacco, and vaping devices, such as e-cigarettes. If you need help quitting, ask your health care  provider. Activity  Follow a regular exercise program to stay fit. This will help you maintain your balance. Ask your health care provider what types of exercise are appropriate for you. If you need a cane or walker, use it as recommended by your health care provider. Wear supportive shoes that have nonskid soles. Safety  Remove any tripping hazards, such as rugs, cords, and clutter. Install safety equipment such as grab bars in bathrooms and safety rails on stairs. Keep rooms and walkways well-lit. General instructions Talk with  your health care provider about your risks for falling. Tell your health care provider if: You fall. Be sure to tell your health care provider about all falls, even ones that seem minor. You feel dizzy, tiredness (fatigue), or off-balance. Take over-the-counter and prescription medicines only as told by your health care provider. These include supplements. Eat a healthy diet and maintain a healthy weight. A healthy diet includes low-fat dairy products, low-fat (lean) meats, and fiber from whole grains, beans, and lots of fruits and vegetables. Stay current with your vaccines. Schedule regular health, dental, and eye exams. Summary Having a healthy lifestyle and getting preventive care can help to protect your health and wellness after age 2. Screening and testing are the best way to find a health problem early and help you avoid having a fall. Early diagnosis and treatment give you the best chance for managing medical conditions that are more common for people who are older than age 60. Falls are a major cause of broken bones and head injuries in people who are older than age 47. Take precautions to prevent a fall at home. Work with your health care provider to learn what changes you can make to improve your health and wellness and to prevent falls. This information is not intended to replace advice given to you by your health care provider. Make sure you discuss any questions you have with your health care provider. Document Revised: 05/26/2020 Document Reviewed: 05/26/2020 Elsevier Patient Education  2024 Elsevier Inc.     Emil Schaumann, MD [facility]

## 2023-10-20 NOTE — Assessment & Plan Note (Signed)
 BP Readings from Last 3 Encounters:  10/20/23 132/78  07/08/22 134/82  05/19/22 122/81  Well-controlled hypertension Continue chlorthalidone  25 mg daily Cardiovascular risks associated with hypertension discussed Dietary approaches to stop hypertension discussed

## 2023-10-21 NOTE — Telephone Encounter (Signed)
 Spoke with patient and he states speaking with [facility] and they stated they will run it again to see if it will be covered. They will let him know by Tuesday

## 2023-10-26 ENCOUNTER — Telehealth: Payer: Self-pay

## 2023-10-26 NOTE — Telephone Encounter (Signed)
 Copied from CRM #[id number]. Topic: Clinical - Medication Question >> Oct 20, 2023 10:33 AM Martinique E wrote: Reason for CRM: Patient stated she was just started on semaglutide -weight management (WEGOVY ) 0.25 MG/0.5ML SOAJ SQ injection, and it would be $1,100 for 28 days of medication, patient questioning if there are any alternatives that his PCP would have to offer. Please call and advise. >> Oct 26, 2023  9:50 AM Drema MATSU wrote: Patient wants WEGOVY  sent to [facility] since it is half the price (out of pocket). [facility] is too expensive at [facility] .

## 2023-10-28 ENCOUNTER — Other Ambulatory Visit: Payer: Self-pay | Admitting: Radiology

## 2023-10-28 DIAGNOSIS — Z9189 Other specified personal risk factors, not elsewhere classified: Secondary | ICD-10-CM

## 2023-10-28 MED ORDER — WEGOVY 0.25 MG/0.5ML ~~LOC~~ SOAJ: 0.2500 mg | SUBCUTANEOUS | 3 refills | Status: AC

## 2023-11-21 ENCOUNTER — Other Ambulatory Visit: Payer: Self-pay | Admitting: Emergency Medicine

## 2023-11-21 ENCOUNTER — Other Ambulatory Visit: Payer: Self-pay

## 2023-11-21 ENCOUNTER — Telehealth: Payer: Self-pay

## 2023-11-21 MED ORDER — SEMAGLUTIDE(0.25 OR 0.5MG/DOS) 2 MG/1.5ML ~~LOC~~ SOPN: 0.5000 mg | PEN_INJECTOR | Freq: Once | SUBCUTANEOUS | 0 refills | Status: DC

## 2023-11-21 MED ORDER — OZEMPIC (0.25 OR 0.5 MG/DOSE) 2 MG/3ML ~~LOC~~ SOPN: 0.5000 mg | PEN_INJECTOR | SUBCUTANEOUS | 0 refills | Status: DC

## 2023-11-21 MED ORDER — SEMAGLUTIDE-WEIGHT MANAGEMENT 0.5 MG/0.5ML ~~LOC~~ SOAJ: 0.5000 mg | Freq: Once | SUBCUTANEOUS | Status: AC

## 2023-11-21 NOTE — Telephone Encounter (Signed)
 Dosage should be increased to 0.5 mg/week

## 2023-11-21 NOTE — Addendum Note (Signed)
 Addended by: ROSALVA LEX RAMAN on: 11/21/2023 02:42 PM   Modules accepted: Orders

## 2023-11-21 NOTE — Telephone Encounter (Signed)
 This has been corrected and sent in

## 2023-11-21 NOTE — Telephone Encounter (Signed)
 Copied from CRM [id number]. Topic: Clinical - Medication Question >> Nov 21, 2023  8:53 AM Viola F wrote: Reason for CRM: Patient is due for his 4th injection of the semaglutide -weight management (WEGOVY ) 0.25 MG/0.5ML SOAJ SQ - wants to know if he needs a new script? Or if dosage will be increased? Please call [phone] (H)

## 2023-11-21 NOTE — Telephone Encounter (Signed)
**Note De-identified  [place] Obfuscation** Please advise 

## 2023-11-21 NOTE — Addendum Note (Signed)
 Addended by: ROSALVA LEX RAMAN on: 11/21/2023 03:13 PM   Modules accepted: Orders

## 2023-11-21 NOTE — Telephone Encounter (Signed)
 Next dosage has been sent in to local pharmacy on file

## 2023-11-21 NOTE — Telephone Encounter (Unsigned)
 Copied from CRM [id number]. Topic: Clinical - Medication Question >> Nov 21, 2023  3:27 PM Viola F wrote: Reason for CRM: Patient needs the WEGOVY  medication resent to the  [id number] - [place], [place] - [address] - it was sent to the wrong pharmacy

## 2023-11-22 ENCOUNTER — Telehealth: Payer: Self-pay

## 2023-11-22 NOTE — Telephone Encounter (Signed)
 Copied from CRM [id number]. Topic: Clinical - Medication Question >> Nov 22, 2023  1:43 PM Nessti S wrote: Reason for CRM: pt called because he wanted to know why pcp changed him from 0.25mg  to 2mg  on ozempic. Call back number [phone]

## 2023-11-23 ENCOUNTER — Telehealth: Payer: Self-pay

## 2023-11-23 ENCOUNTER — Other Ambulatory Visit (HOSPITAL_COMMUNITY): Payer: Self-pay

## 2023-11-23 ENCOUNTER — Other Ambulatory Visit: Payer: Self-pay

## 2023-11-23 MED ORDER — SEMAGLUTIDE-WEIGHT MANAGEMENT 0.5 MG/0.5ML ~~LOC~~ SOAJ: 0.5000 mg | SUBCUTANEOUS | 0 refills | Status: AC

## 2023-11-23 MED ORDER — SEMAGLUTIDE-WEIGHT MANAGEMENT 0.5 MG/0.5ML ~~LOC~~ SOAJ: 0.5000 mg | SUBCUTANEOUS | 0 refills | Status: DC

## 2023-11-23 NOTE — Addendum Note (Signed)
 Addended by: ROSALVA LEX RAMAN on: 11/23/2023 11:33 AM   Modules accepted: Orders

## 2023-11-23 NOTE — Telephone Encounter (Signed)
 Copied from CRM [id number]. Topic: Clinical - Prescription Issue >> Nov 23, 2023  1:16 PM Jasmin G wrote: Reason for CRM: Pt states that her next Wegovy  refill it's due to be refilled until Dec 3rd and he will need refill next week as he will be out of his med. Call pt back at [phone].

## 2023-11-23 NOTE — Telephone Encounter (Signed)
 Ozempic Nathan Fleming is approved exclusively as an adjunct to diet and exercise to improve glycemic control in adults with type 2 diabetes mellitus. A review of patient's medical chart reveals no documented diagnosis of type 2 diabetes or an A1C indicative of diabetes. Therefore, they do not currently meet the criteria for prior authorization of this medication. If clinically appropriate, alternative  options such as Saxenda, Zepbound, or Wegovy  may be considered for this patient.

## 2023-11-23 NOTE — Telephone Encounter (Signed)
 The next dosage has been sent in as of 0.5mg 

## 2023-11-23 NOTE — Telephone Encounter (Signed)
 This was corrected and sent over to [facility]

## 2023-11-24 ENCOUNTER — Telehealth: Payer: Self-pay

## 2023-11-24 MED ORDER — WEGOVY 0.5 MG/0.5ML ~~LOC~~ SOAJ: 0.5000 mg | SUBCUTANEOUS | 0 refills | Status: AC

## 2023-11-24 NOTE — Telephone Encounter (Signed)
 Disregard this, this was sent in and patient did receive this. I believe this was a late response on the rx team side.

## 2023-11-24 NOTE — Telephone Encounter (Signed)
 I did send a prescription for Wegovy  after we saw him.  What happened with this?  Look into this please.

## 2023-11-24 NOTE — Telephone Encounter (Signed)
 I have resent this in for the start date of now

## 2023-11-24 NOTE — Telephone Encounter (Signed)
 Can we send in one of the alternatives down below for the patient?

## 2023-11-24 NOTE — Addendum Note (Signed)
 Addended by: ROSALVA LEX RAMAN on: 11/24/2023 03:50 PM   Modules accepted: Orders

## 2023-11-24 NOTE — Telephone Encounter (Signed)
 Copied from CRM [id number]. Topic: Clinical - Prescription Issue >> Nov 24, 2023 12:58 PM Zy'onna H wrote: Reason for CRM: Patient called in stating his new semaglutide -weight management (WEGOVY ) 0.5 MG/0.5ML SOAJ SQ injection (Starting on 12/22/2023).  Patient is requesting to get it now instead of the original start date - 12/22/2023. He stated he will be completing the previous dose and pen this week: semaglutide -weight management (WEGOVY ) 0.25 MG/0.5ML SOAJ SQ injection  **He does not want to wait and have a gap in his injection schedule**  PCP/PCP Team Please Advise

## 2023-11-25 ENCOUNTER — Telehealth: Payer: Self-pay

## 2023-11-25 ENCOUNTER — Other Ambulatory Visit (HOSPITAL_COMMUNITY): Payer: Self-pay

## 2023-11-25 NOTE — Telephone Encounter (Signed)
 Pharmacy Patient Advocate Encounter   Received notification from CoverMyMeds that prior authorization for Wegovy  0.25mg /0.34ml is required/requested.   Insurance verification completed.   The patient is insured through [facility].   Per test claim: PA required; PA started via CoverMyMeds. KEY [id number] . Waiting for clinical questions to populate.

## 2023-11-29 ENCOUNTER — Other Ambulatory Visit (HOSPITAL_COMMUNITY): Payer: Self-pay

## 2023-11-29 NOTE — Telephone Encounter (Signed)
 Pharmacy Patient Advocate Encounter  Received notification from [facility] that Prior Authorization for Wegovy  0.25mg /0.77ml has been CANCELLED due to [facility] is unable to process the request because the previous prior authorization was denied.     Placed a call to the insurance and per the representative, Wegovy  is a plan exclusion medication for weight loss and is not covered and the prior authorization was initiated by the patient.

## 2023-11-30 NOTE — Telephone Encounter (Signed)
 Called patient and explained that at this time this is not covered through his insurance and they did cancel this prior authorization due to him already have one processed previously and it was denied

## 2023-12-14 ENCOUNTER — Other Ambulatory Visit (HOSPITAL_COMMUNITY): Payer: Self-pay

## 2023-12-19 ENCOUNTER — Telehealth: Payer: Self-pay

## 2023-12-19 ENCOUNTER — Telehealth: Payer: Self-pay | Admitting: Emergency Medicine

## 2023-12-19 NOTE — Telephone Encounter (Unsigned)
 Copied from CRM #[id number]. Topic: Clinical - Medication Refill >> Dec 19, 2023  8:59 AM Joesph B wrote: Medication: Semaglutide ,0.25 or 0.5MG /DOS, (OZEMPIC , 0.25 OR 0.5 MG/DOSE,) 2 MG/3ML SOPN [[id number]]  Patient states medicare is now covering this medication.  Has the patient contacted their pharmacy? Yes (Agent: If no, request that the patient contact the pharmacy for the refill. If patient does not wish to contact the pharmacy document the reason why and proceed with request.) (Agent: If yes, when and what did the pharmacy advise?)  This is the patient's preferred pharmacy:  [id number] [place], [place] - [address] [place] [address] Phone: [phone] Fax: [phone]    Is this the correct pharmacy for this prescription? Yes If no, delete pharmacy and type the correct one.   Has the prescription been filled recently? Yes  Is the patient out of the medication? No, last dosage will be Sunday   Has the patient been seen for an appointment in the last year OR does the patient have an upcoming appointment? Yes  Can we respond through MyChart? Yes  Agent: Please be advised that Rx refills may take up to 3 business days. We ask that you follow-up with your pharmacy. >> Dec 19, 2023  9:03 AM Joesph NOVAK wrote: Patient states he wants to try medicare first for the medication, [facility] and if [facility] deny it, it can be sent to  [facility] [id number], [place] - [address]  [address] [place] [address]  Phone: [phone] Fax: [phone]  Hours: Not open 24 hours  -

## 2023-12-19 NOTE — Telephone Encounter (Unsigned)
 Copied from CRM #[id number]. Topic: Clinical - Medication Refill >> Dec 19, 2023  8:59 AM Joesph B wrote: Medication: Semaglutide ,0.25 or 0.5MG /DOS, (OZEMPIC , 0.25 OR 0.5 MG/DOSE,) 2 MG/3ML SOPN [[id number]]  Patient states medicare is now covering this medication.  Has the patient contacted their pharmacy? Yes (Agent: If no, request that the patient contact the pharmacy for the refill. If patient does not wish to contact the pharmacy document the reason why and proceed with request.) (Agent: If yes, when and what did the pharmacy advise?)  This is the patient's preferred pharmacy:  [id number] [place], [place] - [address] [place] [address] Phone: [phone] Fax: [phone]    Is this the correct pharmacy for this prescription? Yes If no, delete pharmacy and type the correct one.   Has the prescription been filled recently? Yes  Is the patient out of the medication? No, last dosage will be Sunday   Has the patient been seen for an appointment in the last year OR does the patient have an upcoming appointment? Yes  Can we respond through MyChart? Yes  Agent: Please be advised that Rx refills may take up to 3 business days. We ask that you follow-up with your pharmacy.

## 2023-12-19 NOTE — Telephone Encounter (Signed)
 Can pt be prescribed this?Insurance will not cover wegovy 

## 2023-12-19 NOTE — Telephone Encounter (Signed)
 Copied from CRM #[id number]. Topic: Clinical - Medication Prior Auth >> Dec 19, 2023  9:04 AM Joesph NOVAK wrote: Reason for CRM:  Semaglutide ,0.25 or 0.5MG /DOS, (OZEMPIC , 0.25 OR 0.5 MG/DOSE,) 2 MG/3ML SOPN [[id number]]  Patient states medicare is now approving this medication.

## 2023-12-19 NOTE — Telephone Encounter (Signed)
 This is a duplicate.

## 2023-12-19 NOTE — Telephone Encounter (Signed)
 I have explained to the patient that he would need to call his insurance company first and conform if they are willing to pay the for wegovy  as I have not heard of this. Patient did mentioned to me that if it doesn't then he is ok with paying the out of pocket cost

## 2023-12-19 NOTE — Telephone Encounter (Signed)
 Medicare may approve this medication but patient is not a diabetic.  Is Medicare willing to approve this for weight loss?

## 2023-12-20 MED ORDER — OZEMPIC (0.25 OR 0.5 MG/DOSE) 2 MG/3ML ~~LOC~~ SOPN: 0.5000 mg | PEN_INJECTOR | SUBCUTANEOUS | 0 refills | Status: AC

## 2023-12-22 ENCOUNTER — Telehealth: Payer: Self-pay

## 2023-12-22 ENCOUNTER — Other Ambulatory Visit: Payer: Self-pay

## 2023-12-22 MED ORDER — WEGOVY 1 MG/0.5ML ~~LOC~~ SOAJ: 1.0000 mg | SUBCUTANEOUS | 0 refills | Status: AC

## 2023-12-22 NOTE — Telephone Encounter (Signed)
 Copied from CRM #[id number]. Topic: Clinical - Medication Question >> Dec 22, 2023 10:41 AM Suzen RAMAN wrote: Reason for CRM: Patient would like to know if medication semaglutide -weight management (WEGOVY ) 0.5 MG/0.5ML SOAJ SQ injection can be increased and sent to [id number] - [place], [place] - [address]

## 2023-12-22 NOTE — Telephone Encounter (Signed)
 This has been sent in

## 2024-01-14 ENCOUNTER — Other Ambulatory Visit: Payer: Self-pay | Admitting: Emergency Medicine

## 2024-01-14 DIAGNOSIS — I10 Essential (primary) hypertension: Secondary | ICD-10-CM

## 2024-01-14 DIAGNOSIS — E785 Hyperlipidemia, unspecified: Secondary | ICD-10-CM

## 2024-01-23 MED ORDER — WEGOVY 1.7 MG/0.75ML ~~LOC~~ SOAJ: 1.7000 mg | SUBCUTANEOUS | 0 refills | Status: AC

## 2024-01-23 NOTE — Telephone Encounter (Signed)
 I have sent in the next dosage

## 2024-01-23 NOTE — Addendum Note (Signed)
 Addended by: ROSALVA LEX RAMAN on: 01/23/2024 02:13 PM   Modules accepted: Orders

## 2024-01-23 NOTE — Telephone Encounter (Signed)
 Copied from CRM [id number]. Topic: Clinical - Medication Question >> Jan 23, 2024 12:40 PM Nathan Fleming wrote: Reason for CRM:  He has taken his last semaglutide -weight management (WEGOVY ) 1 MG/0.5ML SOAJ SQ injection yesterday. Dr. Sagardia said to call office and request the next MG (higher than 1 MG). Does Dr. Purcell want him to go up to the next MG. He needs his next shot by 01/28/24 and please sent order to [facility].  Please call Nathan Fleming with any questions or concerns at [phone].

## 2024-02-20 ENCOUNTER — Ambulatory Visit: Payer: Self-pay

## 2024-02-21 NOTE — Telephone Encounter (Signed)
 Please advise.

## 2024-02-21 NOTE — Telephone Encounter (Signed)
 1st attempt to reach patient-no answer, left voicemail to call back at [phone]   Reason for Triage: Patient wanting to know what he is able to take over the counter for leg and back pain- He declined an OV with Dr Purcell.

## 2024-02-21 NOTE — Telephone Encounter (Signed)
 This encounter was created in error - please disregard.

## 2024-04-23 ENCOUNTER — Ambulatory Visit: Admitting: Emergency Medicine

## 2024-10-19 ENCOUNTER — Ambulatory Visit
